# Patient Record
Sex: Female | Born: 1990 | Race: Black or African American | Hispanic: No | Marital: Single | Smoking: Never smoker
Health system: Southern US, Community
[De-identification: ages and names within clinical notes are randomized; demographics above are authoritative.]

## PROBLEM LIST (undated history)

## (undated) DIAGNOSIS — F419 Anxiety disorder, unspecified: Secondary | ICD-10-CM

## (undated) DIAGNOSIS — O039 Complete or unspecified spontaneous abortion without complication: Secondary | ICD-10-CM

## (undated) DIAGNOSIS — F32A Depression, unspecified: Secondary | ICD-10-CM

## (undated) DIAGNOSIS — F329 Major depressive disorder, single episode, unspecified: Secondary | ICD-10-CM

## (undated) HISTORY — DX: Anxiety disorder, unspecified: F41.9

## (undated) HISTORY — DX: Complete or unspecified spontaneous abortion without complication: O03.9

## (undated) HISTORY — PX: WISDOM TOOTH EXTRACTION: SHX21

## (undated) HISTORY — DX: Depression, unspecified: F32.A

---
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
## 2016-02-01 ENCOUNTER — Other Ambulatory Visit: Payer: Self-pay | Admitting: Chiropractic Medicine

## 2016-02-01 ENCOUNTER — Ambulatory Visit
Admission: RE | Admit: 2016-02-01 | Discharge: 2016-02-01 | Disposition: A | Discharge status: Home / Self Care | Payer: Self-pay | Source: Ambulatory Visit | Attending: Chiropractic Medicine | Admitting: Chiropractic Medicine

## 2016-02-01 DIAGNOSIS — M545 Low back pain: Secondary | ICD-10-CM

## 2018-03-03 ENCOUNTER — Ambulatory Visit: Payer: 59 | Admitting: Physician Assistant

## 2018-04-02 ENCOUNTER — Ambulatory Visit (INDEPENDENT_AMBULATORY_CARE_PROVIDER_SITE_OTHER): Payer: 59 | Admitting: Physician Assistant

## 2018-04-02 ENCOUNTER — Encounter: Payer: Self-pay | Admitting: Physician Assistant

## 2018-04-02 ENCOUNTER — Encounter

## 2018-04-02 VITALS — BP 106/68 | HR 73 | Ht 63.0 in | Wt 166.0 lb

## 2018-04-02 DIAGNOSIS — G4700 Insomnia, unspecified: Secondary | ICD-10-CM

## 2018-04-02 DIAGNOSIS — F4321 Adjustment disorder with depressed mood: Secondary | ICD-10-CM
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
## 2018-04-02 NOTE — Patient Instructions (Signed)
Recommend Multi-vitamin daily, B Complex, and Omega 3 (Fish Oil)

## 2018-04-03 ENCOUNTER — Encounter: Payer: Self-pay | Admitting: Physician Assistant

## 2018-04-03 MED ORDER — TRAZODONE HCL 50 MG PO TABS: 25.0000 mg | ORAL_TABLET | Freq: Every evening | ORAL | 0 refills | Status: DC | PRN

## 2018-05-11 ENCOUNTER — Ambulatory Visit: Payer: 59 | Admitting: Mental Health

## 2018-05-14 ENCOUNTER — Ambulatory Visit: Payer: 59 | Admitting: Physician Assistant

## 2019-01-31 ENCOUNTER — Ambulatory Visit
Admission: EM | Admit: 2019-01-31 | Discharge: 2019-01-31 | Disposition: A | Discharge status: Home / Self Care | Payer: No Typology Code available for payment source | Attending: Emergency Medicine | Admitting: Emergency Medicine

## 2019-01-31 ENCOUNTER — Other Ambulatory Visit: Payer: Self-pay

## 2019-01-31 DIAGNOSIS — Z20828 Contact with and (suspected) exposure to other viral communicable diseases: Secondary | ICD-10-CM | POA: Diagnosis not present

## 2019-01-31 DIAGNOSIS — Z20822 Contact with and (suspected) exposure to covid-19: Secondary | ICD-10-CM

## 2019-01-31 NOTE — Discharge Instructions (Addendum)
Your COVID test is pending - it is important to quarantine / isolate at home until your results are back. °If you test positive and would like further evaluation for persistent or worsening symptoms, you may schedule an E-visit or virtual (video) visit throughout the [facility] MyChart app or website. ° °PLEASE NOTE: If you develop severe chest pain or shortness of breath please go to the ER or call 9-1-1 for further evaluation --> DO NOT schedule electronic or virtual visits for this. °Please call our office for further guidance / recommendations as needed. °

## 2019-01-31 NOTE — ED Provider Notes (Signed)
[name]    CSN: [id number] Arrival date & time: 01/31/19  1518      History   Chief Complaint Chief Complaint  Patient presents with  . covid test/ diarrhea    HPI Stacy Collier is a 28 y.o. female   Presenting for Covid testing: Exposure: sister Date of exposure: 3 days ago Any fever, symptoms since exposure: yes - diarrhea this morning w/o blood, pain, melena.  Admits to eating dairy last night, to which she usually has loose stools thereafter.   History reviewed. No pertinent past medical history.  There are no problems to display for this patient.   Past Surgical History:  Procedure Laterality Date  . WISDOM TOOTH EXTRACTION      OB History   No obstetric history on file.      Home Medications    Prior to Admission medications   Medication Sig Start Date End Date Taking? Authorizing Provider  traZODone (DESYREL) 50 MG tablet Take 0.5-2 tablets (25-100 mg total) by mouth at bedtime as needed for sleep. 04/03/18   Cherie Ouch, PA-C    Family History Family History  Problem Relation Age of Onset  . Anxiety disorder Mother   . Schizophrenia Maternal Aunt   . Cancer Paternal Grandmother     Social History Social History   Tobacco Use  . Smoking status: Never Smoker  . Smokeless tobacco: Never Used  Substance Use Topics  . Alcohol use: Yes    Alcohol/week: 1.0 standard drinks    Types: 1 Glasses of wine per week    Comment: per week  . Drug use: Never     Allergies   Patient has no known allergies.   Review of Systems Review of Systems  Constitutional: Negative for activity change, appetite change, fatigue and fever.  HENT: Negative for ear pain, sinus pain, sore throat and voice change.   Eyes: Negative for pain, redness and visual disturbance.  Respiratory: Negative for cough and shortness of breath.   Cardiovascular: Negative for chest pain and palpitations.  Gastrointestinal: Positive for diarrhea. Negative  for abdominal distention, abdominal pain, blood in stool, nausea, rectal pain and vomiting.  Musculoskeletal: Negative for arthralgias and myalgias.  Skin: Negative for rash and wound.  Neurological: Negative for syncope and headaches.     Physical Exam Triage Vital Signs ED Triage Vitals  Enc Vitals Group     BP 01/31/19 1554 125/80     Pulse Rate 01/31/19 1554 76     Resp 01/31/19 1554 16     Temp 01/31/19 1554 99.1 F (37.3 C)     Temp Source 01/31/19 1554 Oral     SpO2 01/31/19 1554 98 %     Weight --      Height --      Head Circumference --      Peak Flow --      Pain Score 01/31/19 1555 0     Pain Loc --      Pain Edu? --      Excl. in GC? --    No data found.  Updated Vital Signs BP 125/80 (BP Location: Left Arm)   Pulse 76   Temp 99.1 F (37.3 C) (Oral)   Resp 16   SpO2 98%   Visual Acuity Right Eye Distance:   Left Eye Distance:   Bilateral Distance:    Right Eye Near:   Left Eye Near:    Bilateral Near:     Physical  Exam Constitutional:      General: She is not in acute distress.    Appearance: She is not ill-appearing.  HENT:     Head: Normocephalic and atraumatic.  Eyes:     General: No scleral icterus.    Pupils: Pupils are equal, round, and reactive to light.  Cardiovascular:     Rate and Rhythm: Normal rate.  Pulmonary:     Effort: Pulmonary effort is normal. No respiratory distress.     Breath sounds: No wheezing.  Abdominal:     General: Bowel sounds are normal.     Tenderness: There is no abdominal tenderness.  Skin:    Coloration: Skin is not jaundiced or pale.  Neurological:     Mental Status: She is alert and oriented to person, place, and time.      UC Treatments / Results  Labs (all labs ordered are listed, but only abnormal results are displayed) Labs Reviewed  NOVEL CORONAVIRUS, NAA   Narrative:    Performed at:  [address] [place], [place]  [id number] Lab Director: Rush Farmer MD,  Phone:  [phone]    EKG   Radiology No results found.  Procedures Procedures (including critical care time)  Medications Ordered in UC Medications - No data to display  Initial Impression / Assessment and Plan / UC Course  I have reviewed the triage vital signs and the nursing notes.  Pertinent labs & imaging results that were available during my care of the patient were reviewed by me and considered in my medical decision making (see chart for details).     Patient afebrile, nontoxic, with SpO2 98%.  Covid PCR pending.  Patient to quarantine until results are back.  We will continue supportive management. Patient to avoid dairy.  Return precautions discussed, patient verbalized understanding and is agreeable to plan. Final Clinical Impressions(s) / UC Diagnoses   Final diagnoses:  Exposure to COVID-19 virus     Discharge Instructions     Your COVID test is pending - it is important to quarantine / isolate at home until your results are back. If you test positive and would like further evaluation for persistent or worsening symptoms, you may schedule an E-visit or virtual (video) visit throughout the [facility] app or website.  PLEASE NOTE: If you develop severe chest pain or shortness of breath please go to the ER or call 9-1-1 for further evaluation --> DO NOT schedule electronic or virtual visits for this. Please call our office for further guidance / recommendations as needed.    ED Prescriptions    None     PDMP not reviewed this encounter.   Hall[place], [place], [place] 02/03/19 1239

## 2019-01-31 NOTE — ED Triage Notes (Signed)
Pt presents to UC stating she had a positive covid exposure 3 days ago. Pt states she had diarrhea this morning, but it could have been from eating a lot of dairy.

## 2019-02-01 ENCOUNTER — Telehealth: Payer: Self-pay | Admitting: Emergency Medicine

## 2019-02-01 LAB — NOVEL CORONAVIRUS, NAA: SARS-CoV-2, NAA: NOT DETECTED

## 2019-02-01 NOTE — Telephone Encounter (Signed)
Returned patient's call requesting a work note to be out of work until next Tuesday.  This RN explained that patient tested Negative and was able to return to work if needed.  Patient states her work told her she had to be out.  This RN explained there was no medical reason for her to be out, and we could not write a note stating so.  Patient verbalized understanding and will talk to her job about what follow-up she needs.

## 2019-02-02 ENCOUNTER — Telehealth: Payer: Self-pay | Admitting: Emergency Medicine

## 2019-02-02 NOTE — Telephone Encounter (Signed)
Pt called back today stating her HR department is requesting specific wording in the note we provided her for being out.  This RN explained that we are not able to accommodate their requests, as our providers have written on the note what they felt was appropriate.  This RN stated she could provide the employer/HR department with our phone number and they could call for further explanation if needed, without releasing any of patient's medical information.  Patient verbalized understanding.

## 2019-05-20 ENCOUNTER — Encounter: Payer: Self-pay | Admitting: Physician Assistant

## 2019-05-20 ENCOUNTER — Ambulatory Visit (INDEPENDENT_AMBULATORY_CARE_PROVIDER_SITE_OTHER): Payer: No Typology Code available for payment source | Admitting: Physician Assistant

## 2019-05-20 ENCOUNTER — Other Ambulatory Visit: Payer: Self-pay

## 2019-05-20 DIAGNOSIS — F4321 Adjustment disorder with depressed mood: Secondary | ICD-10-CM

## 2019-05-20 DIAGNOSIS — G4700 Insomnia, unspecified: Secondary | ICD-10-CM | POA: Diagnosis not present

## 2019-05-20 MED ORDER — TRAZODONE HCL 50 MG PO TABS: 25.0000 mg | ORAL_TABLET | Freq: Every evening | ORAL | 0 refills | Status: DC | PRN

## 2019-05-20 NOTE — Progress Notes (Signed)
[facility] Med Check  Patient ID: Stacy Collier,  MRN: [id number]  PCP: Patient, No Pcp Per  Date of Evaluation: 05/20/2019 Time spent:20 minutes  Chief Complaint:  Chief Complaint    Stress      HISTORY/CURRENT STATUS: HPI Not doing well.  This past year has been very stressful.  When she first came Feb 2020, she had been through a bad break up, but they got back together and got engaged in October. She's been planning her wedding and then a few weeks or so ago, he called off the wedding. Last week she was really distraught.  Was not sleeping well and was more tearful.  She feels a lot better this week.  She is in counseling with her pastor.  Stacy Collier and her fianc are still in contact but she is having tough time knowing what to do from here.  She understandably has been very sad but not in a clinical depression.  She is able to go to work.  Energy and motivation are good.  Appetite has not changed.  She is not isolating.  She is not crying easily.  No suicidal or homicidal thoughts.  No extreme anxiety.  She does not sleep as well as she would like but it is better than last week.  Individual Medical History/ Review of Systems: Changes? :No    Past medications for mental health diagnoses include: None  Allergies: Patient has no known allergies.  Current Medications:  Current Outpatient Medications:  .  traZODone (DESYREL) 50 MG tablet, Take 0.5-2 tablets (25-100 mg total) by mouth at bedtime as needed for sleep., Disp: 30 tablet, Rfl: 0 Medication Side Effects: none  Family Medical/ Social History: Changes? No  MENTAL HEALTH EXAM:  There were no vitals taken for this visit.There is no height or weight on file to calculate BMI.  General Appearance: Casual, Neat and Well Groomed  Eye Contact:  Good  Speech:  Clear and Coherent and Normal Rate  Volume:  Normal  Mood:  Euthymic  Affect:  Appropriate  Thought Process:  Goal Directed and Descriptions of  Associations: Intact  Orientation:  Full (Time, Place, and Person)  Thought Content: Logical   Suicidal Thoughts:  No  Homicidal Thoughts:  No  Memory:  WNL  Judgement:  Good  Insight:  Good  Psychomotor Activity:  Normal  Concentration:  Concentration: Good  Recall:  Good  Fund of Knowledge: Good  Language: Good  Assets:  Desire for Improvement  ADL's:  Intact  Cognition: WNL  Prognosis:  Good    DIAGNOSES:    ICD-10-CM   1. Insomnia, unspecified type  G47.00   2. Adjustment disorder with depressed mood  F43.21     Receiving Psychotherapy: Yes    RECOMMENDATIONS:  PDMP was reviewed. I spent 20 minutes with her. We discussed sleep hygiene and stress management. Restart trazodone 50 mg, 1/2-2 p.o. nightly as needed. Continue counseling. Return as needed.  Melony Overly, PA-C

## 2019-07-07 ENCOUNTER — Ambulatory Visit: Payer: No Typology Code available for payment source | Admitting: Family Medicine

## 2019-08-06 ENCOUNTER — Ambulatory Visit: Payer: No Typology Code available for payment source | Attending: Nurse Practitioner | Admitting: Nurse Practitioner

## 2019-08-06 ENCOUNTER — Other Ambulatory Visit: Payer: Self-pay

## 2019-08-06 ENCOUNTER — Encounter: Payer: Self-pay | Admitting: Nurse Practitioner

## 2019-08-06 VITALS — Ht 63.0 in | Wt 160.0 lb

## 2019-08-06 DIAGNOSIS — Z7689 Persons encountering health services in other specified circumstances: Secondary | ICD-10-CM
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
## 2019-08-11 ENCOUNTER — Other Ambulatory Visit: Payer: Self-pay

## 2019-08-11 ENCOUNTER — Ambulatory Visit: Payer: No Typology Code available for payment source | Attending: Nurse Practitioner

## 2019-08-11 DIAGNOSIS — Z7689 Persons encountering health services in other specified circumstances: Secondary | ICD-10-CM

## 2019-08-12 ENCOUNTER — Other Ambulatory Visit: Payer: Self-pay | Admitting: Nurse Practitioner

## 2019-08-12 LAB — CBC WITH DIFFERENTIAL/PLATELET
Basophils Absolute: 0.1 10*3/uL (ref 0.0–0.2)
Basos: 1 %
EOS (ABSOLUTE): 0.1 10*3/uL (ref 0.0–0.4)
Eos: 1 %
Hematocrit: 37.7 % (ref 34.0–46.6)
Hemoglobin: 11 g/dL — ABNORMAL LOW (ref 11.1–15.9)
Immature Grans (Abs): 0 10*3/uL (ref 0.0–0.1)
Immature Granulocytes: 0 %
Lymphocytes Absolute: 1.6 10*3/uL (ref 0.7–3.1)
Lymphs: 29 %
MCH: 21.5 pg — ABNORMAL LOW (ref 26.6–33.0)
MCHC: 29.2 g/dL — ABNORMAL LOW (ref 31.5–35.7)
MCV: 74 fL — ABNORMAL LOW (ref 79–97)
Monocytes Absolute: 0.4 10*3/uL (ref 0.1–0.9)
Monocytes: 6 %
Neutrophils Absolute: 3.4 10*3/uL (ref 1.4–7.0)
Neutrophils: 63 %
Platelets: 356 10*3/uL (ref 150–450)
RBC: 5.11 x10E6/uL (ref 3.77–5.28)
RDW: 15.7 % — ABNORMAL HIGH (ref 11.7–15.4)
WBC: 5.5 10*3/uL (ref 3.4–10.8)

## 2019-08-12 LAB — HEMOGLOBIN A1C
Est. average glucose Bld gHb Est-mCnc: 108 mg/dL
Hgb A1c MFr Bld: 5.4 % (ref 4.8–5.6)

## 2019-08-12 LAB — CMP14+EGFR
ALT: 8 IU/L (ref 0–32)
AST: 16 IU/L (ref 0–40)
Albumin/Globulin Ratio: 1.8 (ref 1.2–2.2)
Albumin: 4.8 g/dL (ref 3.9–5.0)
Alkaline Phosphatase: 96 IU/L (ref 48–121)
BUN/Creatinine Ratio: 20 (ref 9–23)
BUN: 18 mg/dL (ref 6–20)
Bilirubin Total: 0.4 mg/dL (ref 0.0–1.2)
CO2: 24 mmol/L (ref 20–29)
Calcium: 9.5 mg/dL (ref 8.7–10.2)
Chloride: 101 mmol/L (ref 96–106)
Creatinine, Ser: 0.92 mg/dL (ref 0.57–1.00)
GFR calc Af Amer: 99 mL/min/{1.73_m2} (ref >59)
GFR calc non Af Amer: 86 mL/min/{1.73_m2} (ref >59)
Globulin, Total: 2.7 g/dL (ref 1.5–4.5)
Glucose: 93 mg/dL (ref 65–99)
Potassium: 4.1 mmol/L (ref 3.5–5.2)
Sodium: 138 mmol/L (ref 134–144)
Total Protein: 7.5 g/dL (ref 6.0–8.5)

## 2019-08-12 LAB — LIPID PANEL
Chol/HDL Ratio: 2.2 ratio (ref 0.0–4.4)
Cholesterol, Total: 179 mg/dL (ref 100–199)
HDL: 80 mg/dL (ref >39)
LDL Chol Calc (NIH): 86 mg/dL (ref 0–99)
Triglycerides: 67 mg/dL (ref 0–149)
VLDL Cholesterol Cal: 13 mg/dL (ref 5–40)

## 2019-08-12 LAB — VITAMIN D 25 HYDROXY (VIT D DEFICIENCY, FRACTURES): Vit D, 25-Hydroxy: 15.7 ng/mL — ABNORMAL LOW (ref 30.0–100.0)

## 2019-08-12 LAB — HIV ANTIBODY (ROUTINE TESTING W REFLEX): HIV Screen 4th Generation wRfx: NONREACTIVE

## 2019-08-12 LAB — HEPATITIS C ANTIBODY: Hep C Virus Ab: <0.1 s/co ratio (ref 0.0–0.9)

## 2019-08-12 MED ORDER — VITAMIN D (ERGOCALCIFEROL) 1.25 MG (50000 UNIT) PO CAPS: 50000.0000 [IU] | ORAL_CAPSULE | ORAL | 0 refills | Status: DC

## 2019-09-17 ENCOUNTER — Emergency Department (HOSPITAL_COMMUNITY)
Admission: EM | Admit: 2019-09-17 | Discharge: 2019-09-17 | Disposition: A | Discharge status: Home / Self Care | Payer: No Typology Code available for payment source | Attending: Emergency Medicine | Admitting: Emergency Medicine

## 2019-09-17 ENCOUNTER — Emergency Department (HOSPITAL_COMMUNITY): Payer: No Typology Code available for payment source

## 2019-09-17 ENCOUNTER — Encounter (HOSPITAL_COMMUNITY): Payer: Self-pay

## 2019-09-17 DIAGNOSIS — R509 Fever, unspecified: Secondary | ICD-10-CM | POA: Diagnosis present

## 2019-09-17 DIAGNOSIS — U071 COVID-19: Secondary | ICD-10-CM | POA: Diagnosis not present

## 2019-09-17 MED ORDER — ACETAMINOPHEN 325 MG PO TABS: 650.0000 mg | ORAL_TABLET | Freq: Once | ORAL | Status: DC

## 2019-09-17 MED ORDER — ALBUTEROL SULFATE HFA 108 (90 BASE) MCG/ACT IN AERS
2.0000 | INHALATION_SPRAY | Freq: Once | RESPIRATORY_TRACT | Status: AC
  Administered 2019-09-17: 2 via RESPIRATORY_TRACT
  Filled 2019-09-17: qty 6.7

## 2019-09-17 NOTE — ED Triage Notes (Signed)
Pt arrives POV for eval of covid sx. Pt reports dx'd last night. Reports fatigue, muscle aches, weakness, SOB, vomiting.

## 2019-09-17 NOTE — Discharge Instructions (Signed)
You are having symptoms consistent with your Covid diagnosis.  You need to quarantine for 10 days at home.  Expect myalgias and fever.  Take Tylenol or Motrin for fever.  Use albuterol as needed for cough or shortness of breath.  Please pick up a pulse ox machine and return to the ER if your oxygen level is consistently less than 90%.  See your doctor for follow-up.  Return to ER if you have worse shortness of breath, trouble breathing, dehydration.     Person Under Monitoring Name: Stacy Collier  Location: [address] [place] [address]   Infection Prevention Recommendations for Individuals Confirmed to have, or Being Evaluated for, 2019 Novel Coronavirus (COVID-19) Infection Who Receive Care at Home  Individuals who are confirmed to have, or are being evaluated for, COVID-19 should follow the prevention steps below until a healthcare provider or local or state health department says they can return to normal activities.  Stay home except to get medical care You should restrict activities outside your home, except for getting medical care. Do not go to work, school, or public areas, and do not use public transportation or taxis.  Call ahead before visiting your doctor Before your medical appointment, call the healthcare provider and tell them that you have, or are being evaluated for, COVID-19 infection. This will help the healthcare provider's office take steps to keep other people from getting infected. Ask your healthcare provider to call the local or state health department.  Monitor your symptoms Seek prompt medical attention if your illness is worsening (e.g., difficulty breathing). Before going to your medical appointment, call the healthcare provider and tell them that you have, or are being evaluated for, COVID-19 infection. Ask your healthcare provider to call the local or state health department.  Wear a facemask You should wear a facemask that  covers your nose and mouth when you are in the same room with other people and when you visit a healthcare provider. People who live with or visit you should also wear a facemask while they are in the same room with you.  Separate yourself from other people in your home As much as possible, you should stay in a different room from other people in your home. Also, you should use a separate bathroom, if available.  Avoid sharing household items You should not share dishes, drinking glasses, cups, eating utensils, towels, bedding, or other items with other people in your home. After using these items, you should wash them thoroughly with soap and water.  Cover your coughs and sneezes Cover your mouth and nose with a tissue when you cough or sneeze, or you can cough or sneeze into your sleeve. Throw used tissues in a lined trash can, and immediately wash your hands with soap and water for at least 20 seconds or use an alcohol-based hand rub.  Wash your [employer] your hands often and thoroughly with soap and water for at least 20 seconds. You can use an alcohol-based hand sanitizer if soap and water are not available and if your hands are not visibly dirty. Avoid touching your eyes, nose, and mouth with unwashed hands.   Prevention Steps for Caregivers and Household Members of Individuals Confirmed to have, or Being Evaluated for, COVID-19 Infection Being Cared for in the Home  If you live with, or provide care at home for, a person confirmed to have, or being evaluated for, COVID-19 infection please follow these guidelines to prevent infection:  Follow healthcare  provider's instructions Make sure that you understand and can help the patient follow any healthcare provider instructions for all care.  Provide for the patient's basic needs You should help the patient with basic needs in the home and provide support for getting groceries, prescriptions, and other personal needs.  Monitor  the patient's symptoms If they are getting sicker, call his or her medical provider and tell them that the patient has, or is being evaluated for, COVID-19 infection. This will help the healthcare provider's office take steps to keep other people from getting infected. Ask the healthcare provider to call the local or state health department.  Limit the number of people who have contact with the patient If possible, have only one caregiver for the patient. Other household members should stay in another home or place of residence. If this is not possible, they should stay in another room, or be separated from the patient as much as possible. Use a separate bathroom, if available. Restrict visitors who do not have an essential need to be in the home.  Keep older adults, very young children, and other sick people away from the patient Keep older adults, very young children, and those who have compromised immune systems or chronic health conditions away from the patient. This includes people with chronic heart, lung, or kidney conditions, diabetes, and cancer.  Ensure good ventilation Make sure that shared spaces in the home have good air flow, such as from an air conditioner or an opened window, weather permitting.  Wash your hands often Wash your hands often and thoroughly with soap and water for at least 20 seconds. You can use an alcohol based hand sanitizer if soap and water are not available and if your hands are not visibly dirty. Avoid touching your eyes, nose, and mouth with unwashed hands. Use disposable paper towels to dry your hands. If not available, use dedicated cloth towels and replace them when they become wet.  Wear a facemask and gloves Wear a disposable facemask at all times in the room and gloves when you touch or have contact with the patient's blood, body fluids, and/or secretions or excretions, such as sweat, saliva, sputum, nasal mucus, vomit, urine, or feces.  Ensure the  mask fits over your nose and mouth tightly, and do not touch it during use. Throw out disposable facemasks and gloves after using them. Do not reuse. Wash your hands immediately after removing your facemask and gloves. If your personal clothing becomes contaminated, carefully remove clothing and launder. Wash your hands after handling contaminated clothing. Place all used disposable facemasks, gloves, and other waste in a lined container before disposing them with other household waste. Remove gloves and wash your hands immediately after handling these items.  Do not share dishes, glasses, or other household items with the patient Avoid sharing household items. You should not share dishes, drinking glasses, cups, eating utensils, towels, bedding, or other items with a patient who is confirmed to have, or being evaluated for, COVID-19 infection. After the person uses these items, you should wash them thoroughly with soap and water.  Wash laundry thoroughly Immediately remove and wash clothes or bedding that have blood, body fluids, and/or secretions or excretions, such as sweat, saliva, sputum, nasal mucus, vomit, urine, or feces, on them. Wear gloves when handling laundry from the patient. Read and follow directions on labels of laundry or clothing items and detergent. In general, wash and dry with the warmest temperatures recommended on the label.  Clean all  areas the individual has used often Clean all touchable surfaces, such as counters, tabletops, doorknobs, bathroom fixtures, toilets, phones, keyboards, tablets, and bedside tables, every day. Also, clean any surfaces that may have blood, body fluids, and/or secretions or excretions on them. Wear gloves when cleaning surfaces the patient has come in contact with. Use a diluted bleach solution (e.g., dilute bleach with 1 part bleach and 10 parts water) or a household disinfectant with a label that says EPA-registered for coronaviruses. To make  a bleach solution at home, add 1 tablespoon of bleach to 1 quart (4 cups) of water. For a larger supply, add  cup of bleach to 1 gallon (16 cups) of water. Read labels of cleaning products and follow recommendations provided on product labels. Labels contain instructions for safe and effective use of the cleaning product including precautions you should take when applying the product, such as wearing gloves or eye protection and making sure you have good ventilation during use of the product. Remove gloves and wash hands immediately after cleaning.  Monitor yourself for signs and symptoms of illness Caregivers and household members are considered close contacts, should monitor their health, and will be asked to limit movement outside of the home to the extent possible. Follow the monitoring steps for close contacts listed on the symptom monitoring form.   ? If you have additional questions, contact your local health department or call the epidemiologist on call at [phone] (available 24/7). ? This guidance is subject to change. For the most up-to-date guidance from [facility], please refer to their website: [email or web address]

## 2019-09-17 NOTE — ED Provider Notes (Signed)
[facility] EMERGENCY DEPARTMENT Provider Note   CSN: [id number] Arrival date & time: 09/17/19  1355     History Chief Complaint  Patient presents with  . Covid Positive   . Shortness of Breath    Stacy Collier is a 29 y.o. female history of depression anxiety here presenting with fever myalgias and positive Covid. Patient states that for the last 2 to 3 days, she has been having myalgias and sore throat.  Patient went to pharmacy and was tested for Covid and turned out positive.  She has some subjective shortness of breath so came here for evaluation.  Patient states that she lives at home by herself and does not know how she got Covid.  She states that she is otherwise healthy. She didn't get COVID vaccine   The history is provided by the patient.       Past Medical History:  Diagnosis Date  . Anxiety   . Depression     There are no problems to display for this patient.   Past Surgical History:  Procedure Laterality Date  . WISDOM TOOTH EXTRACTION       OB History   No obstetric history on file.     Family History  Problem Relation Age of Onset  . Anxiety disorder Mother   . Schizophrenia Maternal Aunt   . Cancer Paternal Grandmother     Social History   Tobacco Use  . Smoking status: Never Smoker  . Smokeless tobacco: Never Used  Substance Use Topics  . Alcohol use: Yes    Alcohol/week: 1.0 standard drink    Types: 1 Glasses of wine per week    Comment: socially  . Drug use: Never    Home Medications Prior to Admission medications   Medication Sig Start Date End Date Taking? Authorizing Provider  traZODone (DESYREL) 50 MG tablet Take 0.5-2 tablets (25-100 mg total) by mouth at bedtime as needed for sleep. 05/20/19   Melony Overly T, PA-C  Vitamin D, Ergocalciferol, (DRISDOL) 1.25 MG (50000 UNIT) CAPS capsule Take 1 capsule (50,000 Units total) by mouth every 7 (seven) days. 08/12/19   Claiborne Rigg, NP    Allergies      Patient has no known allergies.  Review of Systems   Review of Systems  Constitutional: Positive for chills.  HENT: Positive for sore throat.   Respiratory: Positive for cough.   All other systems reviewed and are negative.   Physical Exam Updated Vital Signs BP 109/74   Pulse 93   Temp (!) 100.8 F (38.2 C) (Oral)   Resp 20   Ht 5\' 3"  (1.6 m)   Wt 73 kg   SpO2 100%   BMI 28.51 kg/m   Physical Exam Vitals and nursing note reviewed.  HENT:     Head: Normocephalic.  Eyes:     Pupils: Pupils are equal, round, and reactive to light.  Cardiovascular:     Rate and Rhythm: Normal rate and regular rhythm.  Pulmonary:     Comments: Tachypneic, no wheezing, diminished bilaterally  Abdominal:     Palpations: Abdomen is soft.  Musculoskeletal:        General: Normal range of motion.     Cervical back: Normal range of motion.  Skin:    General: Skin is warm.     Capillary Refill: Capillary refill takes less than 2 seconds.  Neurological:     General: No focal deficit present.     Mental Status:  She is alert and oriented to person, place, and time.  Psychiatric:        Mood and Affect: Mood normal.        Behavior: Behavior normal.     ED Results / Procedures / Treatments   Labs (all labs ordered are listed, but only abnormal results are displayed) Labs Reviewed - No data to display  EKG EKG Interpretation  Date/Time:  Friday September 17 2019 14:26:02 EDT Ventricular Rate:  94 PR Interval:  142 QRS Duration: 70 QT Interval:  328 QTC Calculation: 410 R Axis:   28 Text Interpretation: Normal sinus rhythm Nonspecific T wave abnormality Abnormal ECG No previous ECGs available Confirmed by Richardean Canal [phone]) on 09/17/2019 3:37:01 PM   Radiology DG Chest Portable 1 View  Result Date: 09/17/2019 CLINICAL DATA:  Provided history: COVID, chest pain. Additional history provided: Patient diagnosed with COVID last night, reports fatigue, muscle aches, weakness, shortness  of breath, vomiting. EXAM: PORTABLE CHEST 1 VIEW COMPARISON:  Report from chest radiographs 05/02/1999 (images unavailable). FINDINGS: Heart size within normal limits. No appreciable airspace consolidation. No evidence of pleural effusion or pneumothorax. No acute bony abnormality identified. IMPRESSION: No evidence of active cardiopulmonary disease. Electronically Signed   By: Jackey Loge DO   On: 09/17/2019 16:17    Procedures Procedures (including critical care time)  Medications Ordered in ED Medications  acetaminophen (TYLENOL) tablet 650 mg (has no administration in time range)    ED Course  I have reviewed the triage vital signs and the nursing notes.  Pertinent labs & imaging results that were available during my care of the patient were reviewed by me and considered in my medical decision making (see chart for details).    MDM Rules/Calculators/A&P                         Stacy Collier is a 29 y.o. female who just tested because of a Covid who presented with some shortness of breath.  Patient has low-grade temp in the ED but her oxygen level is normal.  Her chest x-ray is clear.  I think her symptoms are seen consistent with Covid.  She does not meet criteria for admission right now.  I told her to stay home and quarantine for 10 days.  She can use albuterol as needed and Tylenol and Motrin for fevers.  Stacy Collier was evaluated in Emergency Department on 09/17/2019 for the symptoms described in the history of present illness. She was evaluated in the context of the global COVID-19 pandemic, which necessitated consideration that the patient might be at risk for infection with the SARS-CoV-2 virus that causes COVID-19. Institutional protocols and algorithms that pertain to the evaluation of patients at risk for COVID-19 are in a state of rapid change based on information released by regulatory bodies including the CDC and federal and state organizations. These policies and  algorithms were followed during the patient's care in the ED.    Final Clinical Impression(s) / ED Diagnoses Final diagnoses:  None    Rx / DC Orders ED Discharge Orders    None       Charlynne Pander, MD 09/17/19 [phone]

## 2019-09-23 ENCOUNTER — Telehealth (INDEPENDENT_AMBULATORY_CARE_PROVIDER_SITE_OTHER): Payer: No Typology Code available for payment source | Admitting: Physician Assistant

## 2019-09-23 ENCOUNTER — Encounter: Payer: Self-pay | Admitting: Physician Assistant

## 2019-09-23 ENCOUNTER — Telehealth: Payer: Self-pay | Admitting: Physician Assistant

## 2019-09-23 DIAGNOSIS — U071 COVID-19: Secondary | ICD-10-CM | POA: Diagnosis not present

## 2019-09-23 DIAGNOSIS — F4321 Adjustment disorder with depressed mood: Secondary | ICD-10-CM | POA: Diagnosis not present

## 2019-09-23 MED ORDER — FLUVOXAMINE MALEATE 100 MG PO TABS
ORAL_TABLET | ORAL | 0 refills | Status: DC
Start: 2019-09-23 — End: 2020-01-18

## 2019-09-23 NOTE — Progress Notes (Signed)
[facility]  Patient ID: Stacy Collier,  MRN: [id number]  PCP: Claiborne Rigg, NP  Date of Evaluation: 09/23/2019 Time spent:20 minutes  Chief Complaint:  Chief Complaint    Depression      HISTORY/CURRENT STATUS: HPI not doing well.  She has had relationship problems off and on for several years.  She and her boyfriend had broken up several times, got back together, and then were planning to get married in May 2020.  But then he broke it off.  They got back together again and were planning to move in together soon, and plan to marry next August.  They had another conversation a few weeks ago about where their relationship was going, and she has felt like she is the only one trying.  So she broke it off.  They have been seeing their pastor for premarital counseling but no other counselors.  She feels even worse about the situation because she now has COVID.  She has been pretty sick for a week.  She is still in quarantine and being alone makes it a lot worse.  He has low energy and motivation but that could just be from the physical symptoms of COVID.  States her heart is broken.  Not having panic attacks or generalized anxiety.  She does get triggered if she sees something that he gave her or a car that looks like his, but when that happens it causes more sadness and crying, not anxiety.  Denies suicidal or homicidal thoughts.  Denies dizziness, syncope, seizures, numbness, tingling, tremor, tics, unsteady gait, slurred speech, confusion. Denies muscle or joint pain, stiffness, or dystonia.  Individual Medical History/ Review of Systems: Changes? :No    Past medications for mental health diagnoses include: Trazodone  Allergies: Patient has no known allergies.  Current Medications:  Current Outpatient Medications:  .  ELDERBERRY PO, Take by mouth., Disp: , Rfl:  .  Vitamin D, Ergocalciferol, (DRISDOL) 1.25 MG (50000 UNIT) CAPS capsule, Take 1 capsule (50,000 Units  total) by mouth every 7 (seven) days., Disp: 12 capsule, Rfl: 0 .  zinc gluconate 50 MG tablet, Take 50 mg by mouth daily., Disp: , Rfl:  .  fluvoxaMINE (LUVOX) 100 MG tablet, 1 p.o. twice daily for 2 days, then 1 p.o. 3 times daily, Disp: 40 tablet, Rfl: 0 .  traZODone (DESYREL) 50 MG tablet, Take 0.5-2 tablets (25-100 mg total) by mouth at bedtime as needed for sleep., Disp: 30 tablet, Rfl: 0 Medication Side Effects: none  Family Medical/ Social History: Changes? Yes see HPI  MENTAL HEALTH EXAM:  There were no vitals taken for this visit.There is no height or weight on file to calculate BMI.  General Appearance: Casual, Neat and Well Groomed  Eye Contact:  Good  Speech:  Clear and Coherent and Normal Rate  Volume:  Normal  Mood:  Depressed  Affect:  Depressed and Tearful  Thought Process:  Goal Directed and Descriptions of Associations: Intact  Orientation:  Full (Time, Place, and Person)  Thought Content: Logical   Suicidal Thoughts:  No  Homicidal Thoughts:  No  Memory:  WNL  Judgement:  Good  Insight:  Good  Psychomotor Activity:  Normal  Concentration:  Concentration: Good  Recall:  Good  Fund of Knowledge: Good  Language: Good  Assets:  Desire for Improvement  ADL's:  Intact  Cognition: WNL  Prognosis:  Good    DIAGNOSES:    ICD-10-CM   1. Adjustment disorder with depressed mood  F43.21   2. COVID-19  U07.1     Receiving Psychotherapy: No    RECOMMENDATIONS:  PDMP was reviewed. I provided 20 minutes of nonface-to-face time during this encounter. I discussed the information on Luvox and how a very small study showed that it can treat/prevent long-haul symptoms of COVID.  But more importantly is that it is an antidepressant and I believe it could help with both COVID and the depression.  We discussed the benefits, risks, side effects and she accepts. Start Luvox 100 mg, 1 p.o. twice daily for 2 days, then 1 p.o. 3 times daily until gone. Strongly recommend she  see a Veterinary surgeon. Return in 3 weeks.   Melony Overly, PA-C

## 2019-09-23 NOTE — Telephone Encounter (Signed)
Ms. nasira, janusz are scheduled for a virtual visit with your provider today.    Just as we do with appointments in the office, we must obtain your consent to participate.  Your consent will be active for this visit and any virtual visit you may have with one of our providers in the next 365 days.    If you have a MyChart account, I can also send a copy of this consent to you electronically.  All virtual visits are billed to your insurance company just like a traditional visit in the office.  As this is a virtual visit, video technology does not allow for your provider to perform a traditional examination.  This may limit your provider's ability to fully assess your condition.  If your provider identifies any concerns that need to be evaluated in person or the need to arrange testing such as labs, EKG, etc, we will make arrangements to do so.    Although advances in technology are sophisticated, we cannot ensure that it will always work on either your end or our end.  If the connection with a video visit is poor, we may have to switch to a telephone visit.  With either a video or telephone visit, we are not always able to ensure that we have a secure connection.   I need to obtain your verbal consent now.   Are you willing to proceed with your visit today?   Jakeline S Yore has provided verbal consent on 09/23/2019 for a virtual visit (video or telephone).   Melony Overly, PA-C 09/23/2019  5:37 PM

## 2019-09-24 ENCOUNTER — Ambulatory Visit: Payer: No Typology Code available for payment source | Admitting: Nurse Practitioner

## 2019-12-08 ENCOUNTER — Ambulatory Visit: Payer: No Typology Code available for payment source | Admitting: Nurse Practitioner

## 2019-12-10 ENCOUNTER — Ambulatory Visit: Payer: No Typology Code available for payment source | Attending: Nurse Practitioner | Admitting: Nurse Practitioner

## 2019-12-10 ENCOUNTER — Other Ambulatory Visit: Payer: Self-pay

## 2020-01-18 ENCOUNTER — Ambulatory Visit: Payer: No Typology Code available for payment source | Attending: Nurse Practitioner | Admitting: Nurse Practitioner

## 2020-01-18 ENCOUNTER — Encounter: Payer: Self-pay | Admitting: Nurse Practitioner

## 2020-01-18 ENCOUNTER — Other Ambulatory Visit: Payer: Self-pay

## 2020-01-18 VITALS — BP 111/72 | HR 67 | Ht 63.0 in | Wt 156.0 lb

## 2020-01-18 DIAGNOSIS — Z124 Encounter for screening for malignant neoplasm of cervix: Secondary | ICD-10-CM | POA: Diagnosis not present

## 2020-01-18 NOTE — Progress Notes (Signed)
Assessment & Plan:  Stacy Collier was seen today for gynecologic exam.  Diagnoses and all orders for this visit:  Encounter for Papanicolaou smear for cervical cancer screening -     Cytology - PAP -     Cervicovaginal ancillary only    Patient has been counseled on age-appropriate routine health concerns for screening and prevention. These are reviewed and up-to-date. Referrals have been placed accordingly. Immunizations are up-to-date or declined.    Subjective:   Chief Complaint  Patient presents with   Gynecologic Exam    Pt. Is here for a pap smear.    HPI Stacy Collier 29 y.o. female presents to office today for PAP smear.     Review of Systems  Constitutional: Negative.  Negative for chills, fever, malaise/fatigue and weight loss.  Respiratory: Negative.  Negative for cough, shortness of breath and wheezing.   Cardiovascular: Negative.  Negative for chest pain, orthopnea and leg swelling.  Gastrointestinal: Negative for abdominal pain.  Genitourinary: Negative.  Negative for flank pain.  Skin: Negative.  Negative for rash.  Psychiatric/Behavioral: Negative for suicidal ideas.    Past Medical History:  Diagnosis Date   Anxiety    Depression     Past Surgical History:  Procedure Laterality Date   WISDOM TOOTH EXTRACTION      Family History  Problem Relation Age of Onset   Anxiety disorder Mother    Schizophrenia Maternal Aunt    Cancer Paternal Grandmother     Social History Reviewed with no changes to be made today.   Outpatient Medications Prior to Visit  Medication Sig Dispense Refill   ELDERBERRY PO Take by mouth.     zinc gluconate 50 MG tablet Take 50 mg by mouth daily.     fluvoxaMINE (LUVOX) 100 MG tablet 1 p.o. twice daily for 2 days, then 1 p.o. 3 times daily (Patient not taking: Reported on 01/18/2020) 40 tablet 0   traZODone (DESYREL) 50 MG tablet Take 0.5-2 tablets (25-100 mg total) by mouth at bedtime as needed for sleep. 30  tablet 0   Vitamin D, Ergocalciferol, (DRISDOL) 1.25 MG (50000 UNIT) CAPS capsule Take 1 capsule (50,000 Units total) by mouth every 7 (seven) days. 12 capsule 0   No facility-administered medications prior to visit.    No Known Allergies     Objective:    BP 111/72 (BP Location: Left Arm, Patient Position: Sitting, Cuff Size: Normal)    Pulse 67    Ht 5\' 3"  (1.6 m)    Wt 156 lb (70.8 kg)    SpO2 100%    BMI 27.63 kg/m  Wt Readings from Last 3 Encounters:  01/18/20 156 lb (70.8 kg)  09/17/19 160 lb 15 oz (73 kg)  08/06/19 160 lb (72.6 kg)    Physical Exam Exam conducted with a chaperone present.  Constitutional:      Appearance: She is well-developed and well-nourished.  HENT:     Head: Normocephalic.  Cardiovascular:     Rate and Rhythm: Normal rate and regular rhythm.     Heart sounds: Normal heart sounds.  Pulmonary:     Effort: Pulmonary effort is normal.     Breath sounds: Normal breath sounds.  Abdominal:     General: Bowel sounds are normal.     Palpations: Abdomen is soft.     Hernia: There is no hernia in the right inguinal area or left inguinal area.  Genitourinary:    Exam position: Lithotomy position.  Labia: No labial fusion.        Right: No rash, tenderness, lesion or injury.        Left: No rash, tenderness, lesion or injury.      Vagina: Normal. No signs of injury and foreign body. No vaginal discharge, erythema, tenderness or bleeding.     Cervix: No cervical motion tenderness or friability.     Uterus: Normal. Not deviated and not enlarged.      Adnexa:        Right: No mass, tenderness or fullness.         Left: No mass, tenderness or fullness.       Rectum: Normal. No external hemorrhoid.  Lymphadenopathy:     Lower Body: No right inguinal and no right inguinal adenopathy. No left inguinal and no left inguinal adenopathy.  Skin:    General: Skin is warm and dry.  Neurological:     Mental Status: She is alert and oriented to person, place,  and time.  Psychiatric:        Mood and Affect: Mood and affect normal.        Behavior: Behavior normal.        Thought Content: Thought content normal.        Judgment: Judgment normal.          Patient has been counseled extensively about nutrition and exercise as well as the importance of adherence with medications and regular follow-up. The patient was given clear instructions to go to ER or return to medical center if symptoms don't improve, worsen or new problems develop. The patient verbalized understanding.   Follow-up: Return in about 3 months (around 04/17/2020).   Claiborne Rigg, FNP-BC [facility] [place], [place] [phone]   01/18/2020, 3:48 PM

## 2020-01-20 LAB — CERVICOVAGINAL ANCILLARY ONLY
Bacterial Vaginitis (gardnerella): POSITIVE — AB
Candida Glabrata: NEGATIVE
Candida Vaginitis: POSITIVE — AB
Chlamydia: NEGATIVE
Comment: NEGATIVE
Comment: NEGATIVE
Comment: NEGATIVE
Comment: NEGATIVE
Comment: NEGATIVE
Comment: NORMAL
Neisseria Gonorrhea: NEGATIVE
Trichomonas: NEGATIVE

## 2020-01-23 ENCOUNTER — Other Ambulatory Visit: Payer: Self-pay | Admitting: Nurse Practitioner

## 2020-01-23 MED ORDER — FLUCONAZOLE 150 MG PO TABS: 150.0000 mg | ORAL_TABLET | Freq: Once | ORAL | 0 refills | Status: AC

## 2020-01-23 MED ORDER — METRONIDAZOLE 500 MG PO TABS: 500.0000 mg | ORAL_TABLET | Freq: Two times a day (BID) | ORAL | 0 refills | Status: AC

## 2020-02-17 ENCOUNTER — Telehealth: Payer: Self-pay | Admitting: Nurse Practitioner

## 2020-02-17 ENCOUNTER — Telehealth: Payer: Self-pay

## 2020-02-17 NOTE — Telephone Encounter (Signed)
Pt is inquiring if she could get a medication refilled for metroNIDAZOLE (FLAGYL) 500 MG tablet [[id number]] ENDED  at the [facility] listed or if an appt is needed for this?(Possibly with Dr. Cato Mulligan tomorrow?)

## 2020-02-17 NOTE — Telephone Encounter (Signed)
Copied from CRM [id number]. Topic: General - Other >> Feb 17, 2020 11:00 AM Jaquita Rector A wrote: Reason for CRM: Patient called in to speak to Loreen Freud stated that she have some concerns since her last visit and need to discuss please call Ph# [phone]

## 2020-02-22 NOTE — Telephone Encounter (Signed)
Attempt to reach patient. °No answer and LVM.  °

## 2020-04-18 ENCOUNTER — Other Ambulatory Visit: Payer: Self-pay

## 2020-04-18 ENCOUNTER — Ambulatory Visit: Payer: No Typology Code available for payment source | Attending: Nurse Practitioner | Admitting: Nurse Practitioner

## 2021-02-04 NOTE — L&D Delivery Note (Signed)
Delivery Note   Raechelle, Sarti [[id number]]  At 6:03 PM a viable female was delivered via Vaginal, Spontaneous (Presentation:Vertex).  APGAR: 4, 3   [place], Chauncy Lean [[id number]]  At 6:13 PM a viable female was delivered via Vaginal, Breech (Presentation: Complete Breech ).  APGAR: 4, 3  Both infants lived for a few minutes after birth given previablity.  Placenta status: intact, 3VC with the following complications: Trailing membranes, delivered with help of bimanual exploration and use of small Banjo curette.  Anesthesia:  Epidural Episiotomy: None Lacerations: None Est. Blood Loss (mL): 584  Mom to [facility] after recovery on L&D. Appropriate support given to her and family.     Jaynie Collins, MD, FACOG Obstetrician & Gynecologist, [facility] [employer], [facility]

## 2021-02-28 ENCOUNTER — Other Ambulatory Visit: Payer: Self-pay

## 2021-02-28 ENCOUNTER — Ambulatory Visit (HOSPITAL_COMMUNITY)
Admission: EM | Admit: 2021-02-28 | Discharge: 2021-02-28 | Disposition: A | Discharge status: Home / Self Care | Payer: No Typology Code available for payment source | Attending: Family Medicine | Admitting: Family Medicine

## 2021-02-28 ENCOUNTER — Encounter (HOSPITAL_COMMUNITY): Payer: Self-pay | Admitting: *Deleted

## 2021-02-28 DIAGNOSIS — J069 Acute upper respiratory infection, unspecified: Secondary | ICD-10-CM | POA: Diagnosis not present

## 2021-02-28 DIAGNOSIS — N750 Cyst of Bartholin's gland: Secondary | ICD-10-CM | POA: Diagnosis not present

## 2021-02-28 DIAGNOSIS — Z20822 Contact with and (suspected) exposure to covid-19: Secondary | ICD-10-CM | POA: Diagnosis not present

## 2021-02-28 LAB — POC INFLUENZA A AND B ANTIGEN (URGENT CARE ONLY)
INFLUENZA A ANTIGEN, POC: NEGATIVE
INFLUENZA B ANTIGEN, POC: NEGATIVE

## 2021-02-28 MED ORDER — AMOXICILLIN-POT CLAVULANATE 875-125 MG PO TABS: 1.0000 | ORAL_TABLET | Freq: Two times a day (BID) | ORAL | 0 refills | Status: AC

## 2021-02-28 NOTE — ED Provider Notes (Addendum)
[place]    CSN: [id number] Arrival date & time: 02/28/21  1825      History   Chief Complaint Chief Complaint  Patient presents with   Nausea   Headache   Chills   Generalized Body Aches    HPI Stacy Collier is a 31 y.o. female.    Headache Here for history of nausea and vomiting, chills, and nasal congestion since January 22.  She threw up once that day, and then has not thrown up since.  She is had her last bowel movement actually today, and was not diarrhea.  She is still very nauseated today.  She has been feeling some discomfort on her right posterior labia majora.  First it was burning lingering headache but no longer.  Last menstrual period was 30 January 6  Past Medical History:  Diagnosis Date   Anxiety    Depression     There are no problems to display for this patient.   Past Surgical History:  Procedure Laterality Date   WISDOM TOOTH EXTRACTION      OB History   No obstetric history on file.      Home Medications    Prior to Admission medications   Medication Sig Start Date End Date Taking? Authorizing Provider  amoxicillin-clavulanate (AUGMENTIN) 875-125 MG tablet Take 1 tablet by mouth 2 (two) times daily for 7 days. 02/28/21 03/07/21 Yes Barrett Henle, MD  ELDERBERRY PO Take by mouth.    [provider]  zinc gluconate 50 MG tablet Take 50 mg by mouth daily.    [provider]    Family History Family History  Problem Relation Age of Onset   Anxiety disorder Mother    Schizophrenia Maternal Aunt    Cancer Paternal Grandmother     Social History Social History   Tobacco Use   Smoking status: Never   Smokeless tobacco: Never  Substance Use Topics   Alcohol use: Not Currently    Alcohol/week: 0.0 standard drinks   Drug use: Never     Allergies   Patient has no known allergies.   Review of Systems Review of Systems  Neurological:  Positive for headaches.    Physical Exam Triage  Vital Signs ED Triage Vitals  Enc Vitals Group     BP 02/28/21 1855 120/81     Pulse Rate 02/28/21 1855 93     Resp 02/28/21 1855 18     Temp 02/28/21 1855 98.6 F (37 C)     Temp src --      SpO2 02/28/21 1855 93 %     Weight --      Height --      Head Circumference --      Peak Flow --      Pain Score 02/28/21 1851 5     Pain Loc --      Pain Edu? --      Excl. in [place]? --    No data found.  Updated Vital Signs BP 120/81    Pulse 93    Temp 98.6 F (37 C)    Resp 18    LMP 02/02/2021    SpO2 93%   Visual Acuity Right Eye Distance:   Left Eye Distance:   Bilateral Distance:    Right Eye Near:   Left Eye Near:    Bilateral Near:     Physical Exam Vitals reviewed.  Constitutional:      General: She is not in  acute distress.    Appearance: She is not toxic-appearing.  HENT:     Right Ear: Tympanic membrane and ear canal normal.     Left Ear: Tympanic membrane and ear canal normal.     Nose: Congestion present.     Mouth/Throat:     Mouth: Mucous membranes are moist.     Pharynx: No oropharyngeal exudate or posterior oropharyngeal erythema.  Eyes:     Extraocular Movements: Extraocular movements intact.     Conjunctiva/sclera: Conjunctivae normal.     Pupils: Pupils are equal, round, and reactive to light.  Cardiovascular:     Rate and Rhythm: Normal rate and regular rhythm.     Heart sounds: No murmur heard. Pulmonary:     Effort: Pulmonary effort is normal.     Breath sounds: Normal breath sounds. No stridor. No wheezing, rhonchi or rales.  Genitourinary:    Comments: There is mild swelling of her posterior labia majora, with probably a Bartholin's gland cyst evident, about 1.5 cm diameter. Tender Musculoskeletal:     Cervical back: Neck supple.  Lymphadenopathy:     Cervical: No cervical adenopathy.  Skin:    Capillary Refill: Capillary refill takes less than 2 seconds.     Coloration: Skin is not jaundiced or pale.  Neurological:     General: No focal  deficit present.     Mental Status: She is alert and oriented to person, place, and time.  Psychiatric:        Behavior: Behavior normal.     UC Treatments / Results  Labs (all labs ordered are listed, but only abnormal results are displayed) Labs Reviewed  SARS CORONAVIRUS 2 (TAT 6-24 HRS)  POC INFLUENZA A AND B ANTIGEN (URGENT CARE ONLY)    EKG   Radiology No results found.  Procedures Procedures (including critical care time)  Medications Ordered in UC Medications - No data to display  Initial Impression / Assessment and Plan / UC Course  I have reviewed the triage vital signs and the nursing notes.  Pertinent labs & imaging results that were available during my care of the patient were reviewed by me and considered in my medical decision making (see chart for details).     Flu test is negative.  Will swab for COVID; she is not at high risk for severe infection, so I would not do an oral antiviral.  Will treat the Bartholin's gland cyst with antibiotics, and she will call her gyn tomorrow. Final Clinical Impressions(s) / UC Diagnoses   Final diagnoses:  Viral upper respiratory tract infection  Bartholin's gland cyst     Discharge Instructions      Your flu test is negative.  You have been swabbed for COVID, and the test will result in the next 24 hours. Our staff will call you if positive. If the test is positive, you should quarantine for 5 days.   Take amoxicillin/clavulanic acid 875 mg, 1 tab twice daily with food for 7 days for the possible cellulitis infection in your bottom.  Also call your gynecologist for an appointment soon     ED Prescriptions     Medication Sig Dispense Auth. Provider   amoxicillin-clavulanate (AUGMENTIN) 875-125 MG tablet Take 1 tablet by mouth 2 (two) times daily for 7 days. 14 tablet Danford Tat, Janace Aris, MD      PDMP not reviewed this encounter.   Zenia Resides, MD 02/28/21 Willaim Sheng    Zenia Resides,  MD 02/28/21 2007

## 2021-02-28 NOTE — Discharge Instructions (Signed)
Your flu test is negative.  You have been swabbed for COVID, and the test will result in the next 24 hours. Our staff will call you if positive. If the test is positive, you should quarantine for 5 days.   Take amoxicillin/clavulanic acid 875 mg, 1 tab twice daily with food for 7 days for the possible cellulitis infection in your bottom.  Also call your gynecologist for an appointment soon

## 2021-02-28 NOTE — ED Triage Notes (Signed)
PT had a neg COVID test.Pt reports N/V started on Sunday and Chills ,body aches and HA started on Monday. Possible infection on labia. Pt reports there was a scratch and now she does not know wants going on.

## 2021-03-01 LAB — SARS CORONAVIRUS 2 (TAT 6-24 HRS): SARS Coronavirus 2: NEGATIVE

## 2021-03-20 IMAGING — DX DG CHEST 1V PORT
1 series · 1 of 1 positions shown · non-contrast
Comparison: Report from chest radiographs 05/02/1999 (images
unavailable).

CLINICAL DATA: Provided history: COVID, chest pain. Additional
history provided: Patient diagnosed with COVID last night, reports
fatigue, muscle aches, weakness, shortness of breath, vomiting.

EXAM:
PORTABLE CHEST 1 VIEW

[chest]
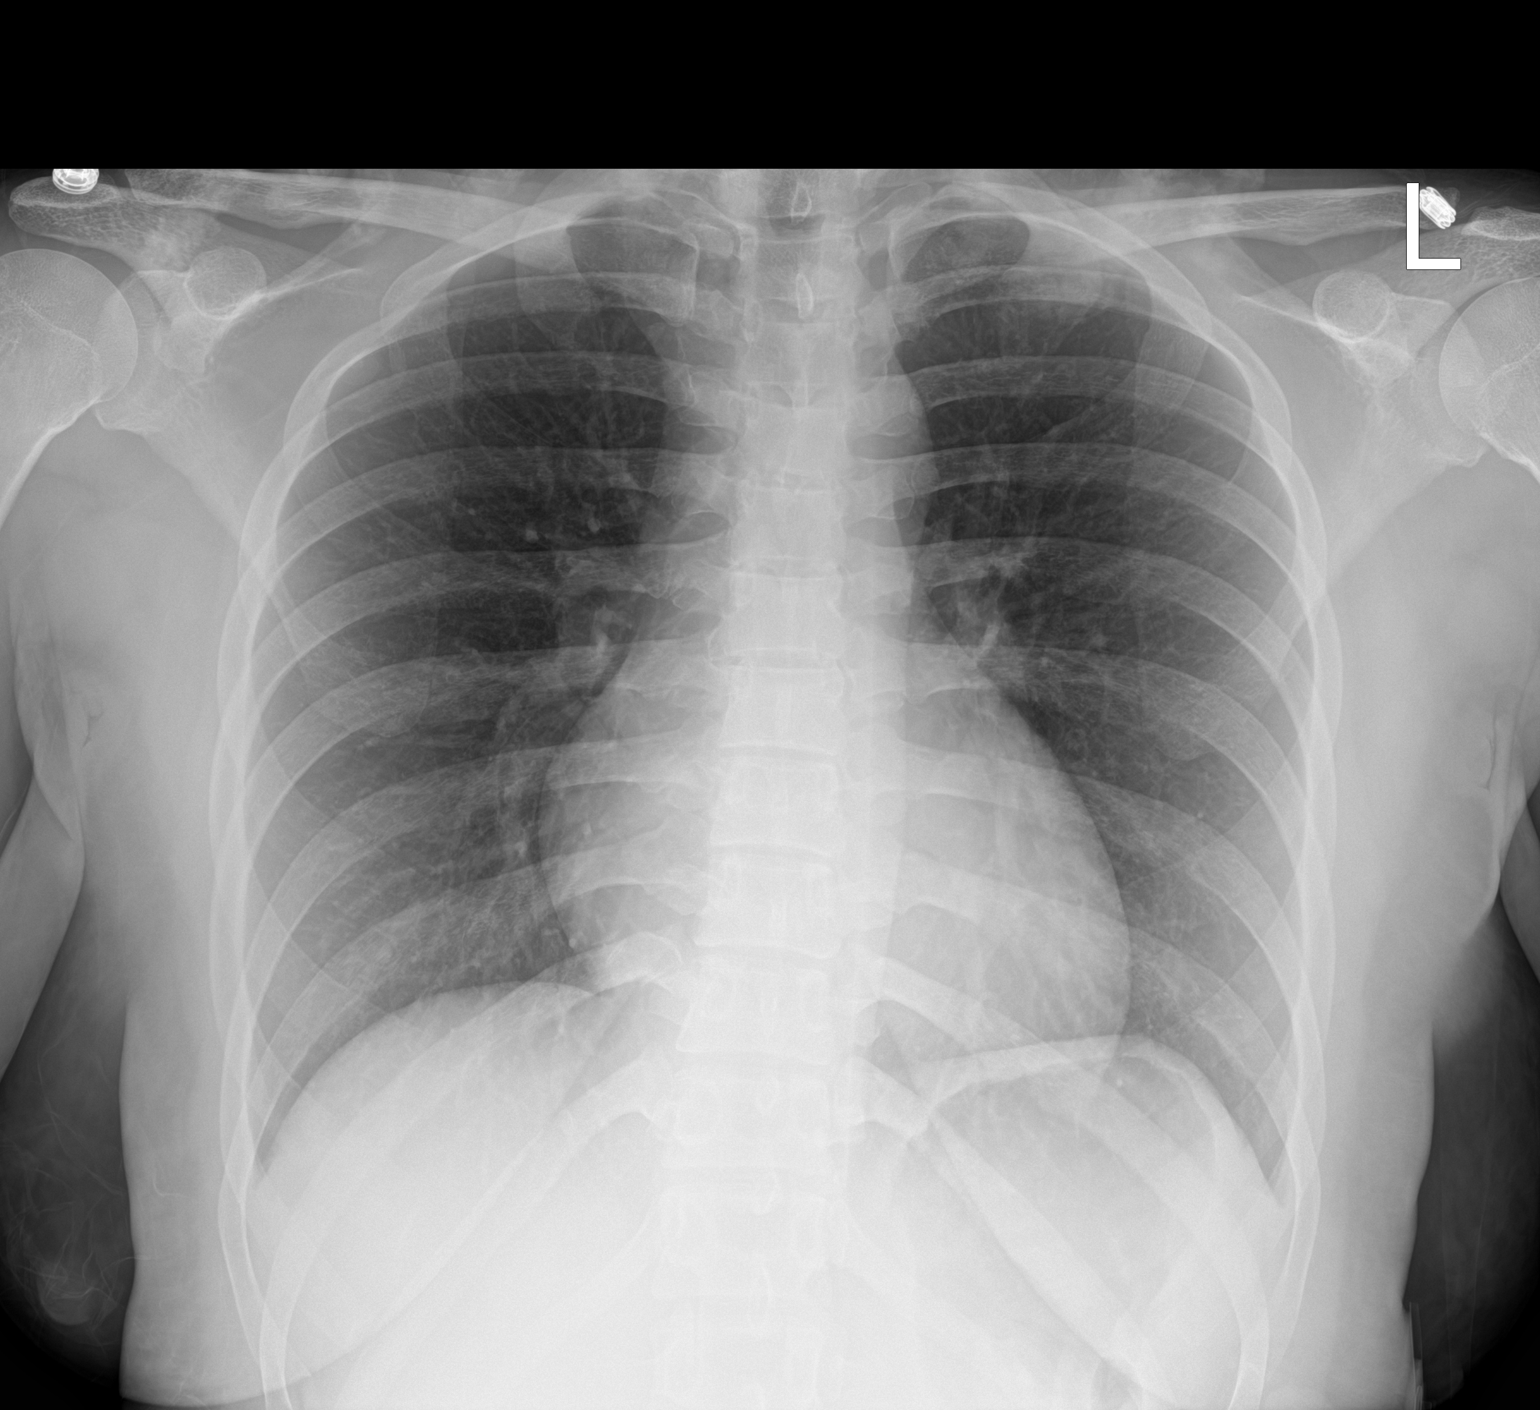

[1 of 1 positions shown; findings below may reference images not displayed]

FINDINGS: Heart size within normal limits. No appreciable airspace
consolidation. No evidence of pleural effusion or pneumothorax. No
acute bony abnormality identified.
IMPRESSION: No evidence of active cardiopulmonary disease.

## 2021-10-24 LAB — HEPATITIS C ANTIBODY: HCV Ab: NEGATIVE

## 2021-10-24 LAB — OB RESULTS CONSOLE HIV ANTIBODY (ROUTINE TESTING): HIV: NONREACTIVE

## 2021-10-24 LAB — OB RESULTS CONSOLE HEPATITIS B SURFACE ANTIGEN: Hepatitis B Surface Ag: NEGATIVE

## 2021-10-24 LAB — OB RESULTS CONSOLE ABO/RH: RH Type: POSITIVE

## 2021-10-24 LAB — OB RESULTS CONSOLE RPR: RPR: NONREACTIVE

## 2021-10-24 LAB — OB RESULTS CONSOLE HGB/HCT, BLOOD
HCT: 34 (ref 29–41)
Hemoglobin: 10.8

## 2021-10-24 LAB — OB RESULTS CONSOLE ANTIBODY SCREEN: Antibody Screen: NEGATIVE

## 2021-10-24 LAB — OB RESULTS CONSOLE RUBELLA ANTIBODY, IGM: Rubella: IMMUNE

## 2021-10-24 LAB — OB RESULTS CONSOLE PLATELET COUNT: Platelets: 358

## 2021-11-07 ENCOUNTER — Other Ambulatory Visit: Payer: Self-pay | Admitting: Obstetrics and Gynecology

## 2021-11-07 DIAGNOSIS — Z363 Encounter for antenatal screening for malformations: Secondary | ICD-10-CM

## 2021-12-06 ENCOUNTER — Telehealth (INDEPENDENT_AMBULATORY_CARE_PROVIDER_SITE_OTHER): Payer: No Typology Code available for payment source

## 2021-12-06 DIAGNOSIS — O0990 Supervision of high risk pregnancy, unspecified, unspecified trimester: Secondary | ICD-10-CM | POA: Insufficient documentation

## 2021-12-06 DIAGNOSIS — Z3490 Encounter for supervision of normal pregnancy, unspecified, unspecified trimester: Secondary | ICD-10-CM | POA: Insufficient documentation

## 2021-12-06 DIAGNOSIS — O30032 Twin pregnancy, monochorionic/diamniotic, second trimester: Secondary | ICD-10-CM

## 2021-12-06 DIAGNOSIS — F32A Depression, unspecified: Secondary | ICD-10-CM | POA: Insufficient documentation

## 2021-12-06 DIAGNOSIS — Z3689 Encounter for other specified antenatal screening: Secondary | ICD-10-CM

## 2021-12-06 DIAGNOSIS — F419 Anxiety disorder, unspecified: Secondary | ICD-10-CM

## 2021-12-06 DIAGNOSIS — O30002 Twin pregnancy, unspecified number of placenta and unspecified number of amniotic sacs, second trimester: Secondary | ICD-10-CM | POA: Insufficient documentation

## 2021-12-06 DIAGNOSIS — Z3480 Encounter for supervision of other normal pregnancy, unspecified trimester: Secondary | ICD-10-CM

## 2021-12-06 NOTE — Progress Notes (Addendum)
New OB Intake  I connected with Launa S. Trachtenberg on 12/06/21 at 11:15 AM EDT by MyChart Video Visit and verified that I am speaking with the correct person using two identifiers. Nurse is located at [facility] and pt is located at home.  I discussed the limitations, risks, security and privacy concerns of performing an evaluation and management service by telephone and the availability of in person appointments. I also discussed with the patient that there may be a patient responsible charge related to this service. The patient expressed understanding and agreed to proceed.  I explained I am completing New OB Intake today. We discussed EDD of 06/01/2022 that is based on LMP of 07/22/203. Pt is G2/P0. I reviewed her allergies, medications, Medical/Surgical/OB history, and appropriate screenings. I informed her of [facility] services. [facility] information placed in AVS. Based on history, this is a low risk pregnancy.  Patient Active Problem List   Diagnosis Date Noted   Supervision of other normal pregnancy, antepartum 12/06/2021   Anxiety 12/06/2021   Depression 12/06/2021   Twin gestation in second trimester 12/06/2021     Concerns addressed today  Delivery Plans Plans to deliver at [facility]. Patient given information for [facility] Healthy Baby website for more information about Women's and [place]. Patient is interested in water birth. Offered upcoming OB visit with CNM to discuss further.  MyChart/Babyscripts MyChart access verified. I explained pt will have some visits in office and some virtually. Babyscripts instructions given and order placed. Patient verifies receipt of registration text/e-mail. Account successfully created and app downloaded.  Blood Pressure Cuff/Weight Scale Patient has private insurance; instructed to purchase blood pressure cuff and bring to first prenatal appt. Explained after first prenatal appt pt will check weekly and document in 35. Patient does not have  weight scale; patient may purchase if they desire to track weight weekly in Babyscripts.  Anatomy US Explained first scheduled [place] will be around 19 weeks. Anatomy US scheduled for 01/07/2022 at 2:15 PM. Pt notified to arrive at 2:30 PM.  Labs Discussed Johnsie Cancel genetic screening with patient. Would like both Panorama and Horizon drawn at new OB visit. Routine prenatal labs needed.  COVID Vaccine Patient has not had COVID vaccine.   Is patient a CenteringPregnancy candidate?  Declined Declined due to schedules.     Is patient a Mom+Baby Combined Care candidate?  Not a candidate  due to twin gestation   Social Determinants of Health Food Insecurity: Patient denies food insecurity. WIC Referral: Patient is interested in referral to [facility].  Transportation: Patient denies transportation needs. Childcare: Discussed no children allowed at ultrasound appointments. Offered childcare services; patient declines childcare services at this time.  First visit review I reviewed new OB appt with patient. I explained they will have a provider visit that includes initial ob labs, genetic screening, AFP, and may require pelvic exam. Explained pt will be seen by Gaylan Gerold CNM at first visit; encounter routed to appropriate provider. Explained that patient will be seen by pregnancy navigator following visit with provider.   Mariane Baumgarten, [place] 12/06/2021  11:32 AM

## 2021-12-07 NOTE — BH Specialist Note (Unsigned)
Integrated Behavioral Health via Telemedicine Visit  12/11/2021 Stacy Collier [id number]  Number of [place] Clinician visits: 1- Initial Visit  Session Start time: 5102   Session End time: 5852  Total time in minutes: 60   Referring Provider: Gaylan Gerold, CNM Patient/Family location: Home [facility] Provider location: [facility] [place] [facility]  All persons participating in visit: Patient Stacy Collier  and [place]   Types of Service: Individual psychotherapy and Video visit  I connected with [place] and/or [place]  via  Telephone or Video Enabled Telemedicine Application  (Video is Caregility application) and verified that I am speaking with the correct person using two identifiers. Discussed confidentiality: Yes   I discussed the limitations of telemedicine and the availability of in person appointments.  Discussed there is a possibility of technology failure and discussed alternative modes of communication if that failure occurs.  I discussed that engaging in this telemedicine visit, they consent to the provision of behavioral healthcare and the services will be billed under their insurance.  Patient and/or legal guardian expressed understanding and consented to Telemedicine visit: Yes   Presenting Concerns: Patient and/or family reports the following symptoms/concerns: Feelings of depression and anxiety that come and go, while adjusting to positive twin pregnancy months after engagement, as well as difficulty having to be dependent on others, and fear of childbirth.  Duration of problem: Current pregnancy; Severity of problem: moderate  Patient and/or Family's Strengths/Protective Factors: Social connections, Concrete supports in place (healthy food, safe environments, etc.), and Sense of purpose  Goals Addressed: Patient will:  Reduce symptoms of: anxiety, depression, and  stress   Increase knowledge and/or ability of: healthy habits   Demonstrate ability to: Increase healthy adjustment to current life circumstances  Progress towards Goals: Ongoing  Interventions: Interventions utilized:  Functional Assessment of ADLs, Psychoeducation and/or Health Education, Link to [employer], and Supportive Reflection Standardized Assessments completed: GAD-7 and PHQ 9  Patient and/or Family Response: Patient agrees with treatment plan.   Assessment: Patient currently experiencing Adjustment disorder with mixed anxious and depressed mood.   Patient may benefit from psychoeducation and brief therapeutic interventions regarding coping with symptoms of depression, anxiety, stress .  Plan: Follow up with behavioral health clinician on : Two weeks Behavioral recommendations:  -Continue taking prenatal vitamin daily -Read through After Visit Summary information; use as needed -Prioritize healthy self-care (regular meals, adequate rest; allowing practical help from supportive friends and family)  Referral(s): [place] (In Clinic)  I discussed the assessment and treatment plan with the patient and/or parent/guardian. They were provided an opportunity to ask questions and all were answered. They agreed with the plan and demonstrated an understanding of the instructions.   They were advised to call back or seek an in-person evaluation if the symptoms worsen or if the condition fails to improve as anticipated.  [place], LCSW      12/11/2021    3:03 PM 01/18/2020    3:24 PM 08/06/2019    3:40 PM 04/03/2018    7:30 AM  Depression screen PHQ 2/9  Decreased Interest 1 0 0   Down, Depressed, Hopeless 1 0 0   PHQ - 2 Score 2 0 0   Altered sleeping 1 0 0   Tired, decreased energy 3 0 1   Change in appetite 1 0 0   Feeling bad or failure about yourself  1 0 0  Trouble concentrating 0 0 0   Moving slowly or fidgety/restless  1 0 0   Suicidal thoughts 0 0 0   PHQ-9 Score 9 0 1      Information is confidential and restricted. Go to Review Flowsheets to unlock data.      12/11/2021    3:17 PM 01/18/2020    3:23 PM 08/06/2019    3:41 PM  GAD 7 : Generalized Anxiety Score  Nervous, Anxious, on Edge 1 0 2  Control/stop worrying 0 0 2  Worry too much - different things 1 0 2  Trouble relaxing 0 0 2  Restless 0 0 0  Easily annoyed or irritable 2 0 3  Afraid - awful might happen 1 0 0  Total GAD 7 Score 5 0 11

## 2021-12-10 ENCOUNTER — Telehealth: Payer: Self-pay | Admitting: Family Medicine

## 2021-12-10 NOTE — Telephone Encounter (Signed)
Call placed to pt. Spoke with pt. Pt states having nasal congestion, running nose  and sore throat this morning. Has not been exposed to flu or covid.  Has taken Tylenol for headache, but didn't help. Does not have CHTN or GHTN.  Sent patient safe meds list to mychart.  Pt also advised to go to urgent care to for covid and flu testing. Pt verbalized understanding.   Colletta [place], RNC

## 2021-12-10 NOTE — Telephone Encounter (Signed)
Patient does not feel well would like a nurse to give her a call about what medications she can take prior to her next appt

## 2021-12-11 ENCOUNTER — Ambulatory Visit (INDEPENDENT_AMBULATORY_CARE_PROVIDER_SITE_OTHER): Payer: No Typology Code available for payment source | Admitting: Clinical

## 2021-12-11 DIAGNOSIS — F4323 Adjustment disorder with mixed anxiety and depressed mood: Secondary | ICD-10-CM

## 2021-12-11 NOTE — Patient Instructions (Signed)
[facility] [address] [place], [place] [address] [phone] (main office) [phone] (Sayid Moll's office)  [email or web address]       BRAINSTORMING  Develop a Plan Goals: Provide a way to start conversation about your new life with a baby Assist parents in recognizing and using resources within their reach Help pave the way before birth for an easier period of transition afterwards.  Make a list of the following information to keep in a central location: Full name of Mom and Partner: _____________________________________________ Baby's full name and Date of Birth: ___________________________________________ Home Address: ___________________________________________________________ ________________________________________________________________________ Home Phone: ____________________________________________________________ Parents' cell numbers: _____________________________________________________ ________________________________________________________________________ Name and contact info for OB: ______________________________________________ Name and contact info for Pediatrician:________________________________________ Contact info for Lactation Consultants: ________________________________________  REST and SLEEP *You each need at least 4-5 hours of uninterrupted sleep every day. Write specific names and contact information.* How are you going to rest in the postpartum period? While partner's home? When partner returns to work? When you both return to work? Where will your baby sleep? Who is available to help during the day? Evening? Night? Who could move in for a period to help support you? What are some ideas to help you get enough  sleep? __________________________________________________________________________________________________________________________________________________________________________________________________________________________________________ NUTRITIOUS FOOD AND DRINK *Plan for meals before your baby is born so you can have healthy food to eat during the immediate postpartum period.* Who will look after breakfast? Lunch? Dinner? List names and contact information. Brainstorm quick, healthy ideas for each meal. What can you do before baby is born to prepare meals for the postpartum period? How can others help you with meals? Which grocery stores provide online shopping and delivery? Which restaurants offer take-out or delivery options? ______________________________________________________________________________________________________________________________________________________________________________________________________________________________________________________________________________________________________________________________________________________________________________________________________  CARE FOR MOM *It's important that mom is cared for and pampered in the postpartum period. Remember, the most important ways new mothers need care are: sleep, nutrition, gentle exercise, and time off.* Who can come take care of mom during this period? Make a list of people with their contact information. List some activities that make you feel cared for, rested, and energized? Who can make sure you have opportunities to do these things? Does mom have a space of her very own within your home that's just for her? Make a "Mama Cave" where she can be comfortable, rest, and renew herself  daily. ______________________________________________________________________________________________________________________________________________________________________________________________________________________________________________________________________________________________________________________________________________________________________________________________________    CARE FOR AND FEEDING BABY *Knowledgeable and encouraging people will offer the best support with regard to feeding your baby.* Educate yourself and choose the best feeding option for your baby. Make a list of people who will guide, support, and be a resource for you as your care for and feed your baby. (Friends that have breastfed or are currently breastfeeding, lactation consultants, breastfeeding support groups, etc.) Consider a postpartum doula. (These websites can give you information: dona.org & padanc.org) Seek out local breastfeeding resources like the breastfeeding support group at [facility] or [employer]. ______________________________________________________________________________________________________________________________________________________________________________________________________________________________________________________________________________________________________________________________________________________________________________________________________  CHORES AND ERRANDS Who can help with a thorough cleaning before baby is born? Make a list of people who will help with housekeeping and chores, like laundry, light cleaning, dishes, bathrooms, etc. Who can run some errands for you? What can you do to make sure you are stocked with basic supplies before baby is born? Who is going to do the  shopping? ______________________________________________________________________________________________________________________________________________________________________________________________________________________________________________________________________________________________________________________________________________________________________________________________________     Family Adjustment *Nurture yourselves.it helps parents be more loving and allows for better bonding with their child.* What sorts of things do   doing together? Which activities help you to connect and strengthen your relationship? Make a list of those things. Make a list of people whom you trust to care for your baby so you can have some time together as a couple. What types of things help partner feel connected to Mom? Make a list. What needs will partner have in order to bond with baby? Other children? Who will care for them when you go into labor and while you are in the hospital? Think about what the needs of your older children might be. Who can help you meet those needs? In what ways are you helping them prepare for bringing baby home? List some specific strategies you have for family adjustment. _______________________________________________________________________________________________________________________________________________________________________________________________________________________________________________________________________________________________________________________________________________  SUPPORT *Someone who can empathize with experiences normalizes your problems and makes them more bearable.* Make a list of other friends, neighbors, and/or co-workers you know with infants (and small children, if applicable) with whom you can connect. Make a list of local or online support groups, mom groups, etc. in which you can be  involved. ______________________________________________________________________________________________________________________________________________________________________________________________________________________________________________________________________________________________________________________________________________________________________________________________________  Childcare Plans Investigate and plan for childcare if mom is returning to work. Talk about mom's concerns about her transition back to work. Talk about partner's concerns regarding this transition.  Mental Health *Your mental health is one of the highest priorities for a pregnant or postpartum mom.* 1 in 5 women experience anxiety and/or depression from the time of conception through the first year after birth. Postpartum Mood Disorders are the #1 complication of pregnancy and childbirth and the suffering experienced by these mothers is not necessary! These illnesses are temporary and respond well to treatment, which often includes self-care, social support, talk therapy, and medication when needed. Women experiencing anxiety and depression often say things like: "I'm supposed to be happy.why do I feel so sad?", "Why can't I snap out of it?", "I'm having thoughts that scare me." There is no need to be embarrassed if you are feeling these symptoms: Overwhelmed, anxious, angry, sad, guilty, irritable, hopeless, exhausted but can't sleep You are NOT alone. You are NOT to blame. With help, you WILL be well. Where can I find help? Medical professionals such as your OB, midwife, gynecologist, family practitioner, primary care provider, pediatrician, or mental health providers; [facility] support groups: Feelings After Birth, Breastfeeding Support Group, Baby and Me Group, and Fit 4 Two exercise classes. You have permission to ask for help. It will confirm your feelings, validate your experiences,  share/learn coping strategies, and gain support and encouragement as you heal. You are important! BRAINSTORM Make a list of local resources, including resources for mom and for partner. Identify support groups. Identify people to call late at night - include names and contact info. Talk with partner about perinatal mood and anxiety disorders. Talk with your OB, midwife, and doula about baby blues and about perinatal mood and anxiety disorders. Talk with your pediatrician about perinatal mood and anxiety disorders.   Support & Sanity Savers   What do you really need?  Basics In preparing for a new baby, many expectant parents spend hours shopping for baby clothes, decorating the nursery, and deciding which car seat to buy. Yet most don't think much about what the reality of parenting a newborn will be like, and what they need to make it through that. So, here is the advice of experienced parents. We know you'll read this, and think "they're exaggerating, I don't really need that." Just trust us on these, OK? Plan for all of   this, and if it turns out you don't need it, come back and teach us how you did it!  Must-Haves (Once baby's survival needs are met, make sure you attend to your own survival needs!) Sleep An average newborn sleeps 16-18 hours per day, over 6-7 sleep periods, rarely more than three hours at a time. It is normal and healthy for a newborn to wake throughout the night... but really hard on parents!! Naps. Prioritize sleep above any responsibilities like: cleaning house, visiting friends, running errands, etc.  Sleep whenever baby sleeps. If you can't nap, at least have restful times when baby eats. The more rest you get, the more patient you will be, the more emotionally stable, and better at solving problems.  Food You may not have realized it would be difficult to eat when you have a newborn. Yet, when we talk to countless new parents, they say things like "it may be 2:00 pm  when I realize I haven't had breakfast yet." Or "every time we sit down to dinner, baby needs to eat, and my food gets cold, so I don't bother to eat it." Finger food. Before your baby is born, stock up with one months' worth of food that: 1) you can eat with one hand while holding a baby, 2) doesn't need to be prepped, 3) is good hot or cold, 4) doesn't spoil when left out for a few hours, and 5) you like to eat. Think about: nuts, dried fruit, Clif bars, pretzels, jerky, gogurt, baby carrots, apples, bananas, crackers, cheez-n-crackers, string cheese, hot pockets or frozen burritos to microwave, garden burgers and breakfast pastries to put in the toaster, yogurt drinks, etc. Restaurant Menus. Make lists of your favorite restaurants & menu items. When family/friends want to help, you can give specific information without much thought. They can either bring you the food or send gift cards for just the right meals. Freezer Meals.  Take some time to make a few meals to put in the freezer ahead of time.  Easy to freeze meals can be anything such as soup, lasagna, chicken pie, or spaghetti sauce. Set up a Meal Schedule.  Ask friends and family to sign up to bring you meals during the first few weeks of being home. (It can be passed around at baby showers!) You have no idea how helpful this will be until you are in the throes of parenting.  www.takethemameal.com is a great website to check out. Emotional Support Know who to call when you're stressed out. Parenting a newborn is very challenging work. There are times when it totally overwhelms your normal coping abilities. EVERY NEW PARENT NEEDS TO HAVE A PLAN FOR WHO TO CALL WHEN THEY JUST CAN'T COPE ANY MORE. (And it has to be someone other than the baby's other parent!) Before your baby is born, come up with at least one person you can call for support - write their phone number down and post it on the refrigerator. Anxiety & Sadness. Baby blues are normal after  pregnancy; however, there are more severe types of anxiety & sadness which can occur and should not be ignored.  They are always treatable, but you have to take the first step by reaching out for help. [facility] offers a "Mom Talk" group which meets every Tuesday from 10 am - 11 am.  This group is for new moms who need support and connection after their babies are born.  Call [phone].  Really, Really Helpful (Plan for them!   Make sure these happen often!!) Physical Support with Taking Care of Yourselves Asking friends and family. Before your baby is born, set up a schedule of people who can come and visit and help out (or ask a friend to schedule for you). Any time someone says "let me know what I can do to help," sign them up for a day. When they get there, their job is not to take care of the baby (that's your job and your joy). Their job is to take care of you!  Postpartum doulas. If you don't have anyone you can call on for support, look into postpartum doulas:  professionals at helping parents with caring for baby, caring for themselves, getting breastfeeding started, and helping with household tasks. www.padanc.org is a helpful website for learning about doulas in our area. Peer Support / Parent Groups Why: One of the greatest ideas for new parents is to be around other new parents. Parent groups give you a chance to share and listen to others who are going through the same season of life, get a sense of what is normal infant development by watching several babies learn and grow, share your stories of triumph and struggles with empathetic ears, and forgive your own mistakes when you realize all parents are learning by trial and error. Where to find: There are many places you can meet other new parents throughout our community.  [facility] offers the following classes for new moms and their [place] ones:  Baby and Me (Birth to Crawling) and Breastfeeding Support Group. Go to  www.[email or web address] or call [phone] for more information. Time for your Relationship It's easy to get so caught up in meeting baby's immediate needs that it's hard to find time to connect with your partner, and meet the needs of your relationship. It's also easy to forget what "quality time with your partner" actually looks like. If you take your baby on a date, you'd be amazed how much of your couple time is spent feeding the baby, diapering the baby, admiring the baby, and talking about the baby. Dating: Try to take time for just the two of you. Babysitter tip: Sometimes when moms are breastfeeding a newborn, they find it hard to figure out how to schedule outings around baby's unpredictable feeding schedules. Have the babysitter come for a three hour period. When she comes over, if baby has just eaten, you can leave right away, and come back in two hours. If baby hasn't fed recently, you start the date at home. Once baby gets hungry and gets a good feeding in, you can head out for the rest of your date time. Date Nights at Home: If you can't get out, at least set aside one evening a week to prioritize your relationship: whenever baby dozes off or doesn't have any immediate needs, spend a [place] time focusing on each other. Potential conflicts: The main relationship conflicts that come up for new parents are: issues related to sexuality, financial stresses, a feeling of an unfair division of household tasks, and conflicts in parenting styles. The more you can work on these issues before baby arrives, the better!  Fun and Frills (Don't forget these. and don't feel guilty for indulging in them!) Everyone has something in life that is a fun F[place]ler treat that they do just for themselves. It may be: reading the morning paper, or going for a daily jog, or having coffee with a friend once a week, or going to a movie on Friday nights,   or fine chocolates, or bubble baths, or curling up with a good  book. Unless you do fun things for yourself every now and then, it's hard to have the energy for fun with your baby. Whatever your "special" treats are, make sure you find a way to continue to indulge in them after your baby is born. These special moments can recharge you, and allow you to return to baby with a new joy   PERINATAL MOOD DISORDERS: MATERNAL MENTAL HEALTH FROM CONCEPTION THROUGH THE POSTPARTUM PERIOD   _________________________________________Emergency and Crisis Resources If you are an imminent risk to self or others, are experiencing intense personal distress, and/or have noticed significant changes in activities of daily living, call:  911 [facility]: [phone]  [address], [place], [place], [address] Mobile Crisis: 877-626-1772 National Suicide Hotline: 988 Or visit the following crisis centers: Local Emergency Departments [facility]: [address], [place]  [phone]. Hours: 8:30AM-5PM. Insurance Accepted: Medicaid, Medicare, and Uninsured.  [facility]:  [address], [place]  Mon-Friday 8am-3pm, [phone]                                                                                  ___________ Non-Crisis Resources To identify specific providers that are covered by your insurance, contact your insurance company or local agencies:  [facility]: 1-800-256-2452 [facility]: 888-581-9988 [employer] [facility]: 1-800-939-5911 Postpartum Support International- Warm-line: 1-800-944-4773                                                      __Outpatient Therapy and Medication Management   Providers:  [facility]: [phone] Hours: 9AM-5PM  Insurance Accepted: AARP, Aetna, BCBS, Cigna, [employer], Humana, Medicare  [facility] [phone] Hours: 8AM-5:30PM  nsurance Accepted: All insurances EXCEPT AARP, Aetna,  [employer], and Humana [facility]: [phone] Hours: 8AM-8PM Insurance Accepted: Aetna, BCBS, Cigna, [employer], Medicaid, Medicare, Uninsured [facility]: [phone] [facility]: [phone] Hours: 8:30AM-7PM Insurance Accepted: Aetna, BCBS, Medicaid, Medicare, Tricare, United Healthcare [facility]:  [phone]   Hours:9AM-5PM Insurance Accepted:  Aetna, BCBS, [facility], Medicaid, United Health Care  [facility]: [phone] Hours: 9AM-5:30PM Insurance Accepted: AARP, Aetna, BCBS, Cigna, and Medicaid, Medicare, United Health Care [facility]:  [phone] Hours: 9am-5pm Insurance Accepted: BCBS; they do not accept Medicaid/Medicare [facility]: [phone] Hours: 9am-9pm Insurance Accepted: All major insurance including Medicaid and Medicare [facility]: [phone] Hours: 9AM- 5PM Insurance Accepted: All insurances EXCEPT Medicaid and Medicare. [facility]: [phone]   ____________                                                                       Parenting Support Groups [facility]: [phone] [facility]:  [phone] Family Support Network: (support for children in the NICU and/or with special needs), [phone]   ___________                                                                 Mental Health Support Groups Mental Health Association: [phone]    _____________                                                                                  Online Resources Postpartum Support International: http://www.postpartum.net/  800-944-4PPD 2Moms Supporting Moms:  www.momssupportingmoms.net    

## 2021-12-12 NOTE — BH Specialist Note (Unsigned)
Integrated Behavioral Health via Telemedicine Visit  12/12/2021 Stacy Collier [id number]  Number of Integrated Behavioral Health Clinician visits: 1- Initial Visit  Session Start time: 1450   Session End time: 1550  Total time in minutes: 60   Referring Provider: Edd Arbour, CNM Patient/Family location: Home  [facility] Provider location: [facility]  All persons participating in visit: Patient Stacy Collier and [facility] Stacy Collier    Types of Service: Individual psychotherapy and Video visit  I connected with Stacy Collier and/or Stacy Collier's  n/a  via  Telephone or Video Enabled Telemedicine Application  (Video is Caregility application) and verified that I am speaking with the correct person using two identifiers. Discussed confidentiality: Yes   I discussed the limitations of telemedicine and the availability of in person appointments.  Discussed there is a possibility of technology failure and discussed alternative modes of communication if that failure occurs.  I discussed that engaging in this telemedicine visit, they consent to the provision of behavioral healthcare and the services will be billed under their insurance.  Patient and/or legal guardian expressed understanding and consented to Telemedicine visit: Yes   Presenting Concerns: Patient and/or family reports the following symptoms/concerns: Primary concern today is feeling anxious about first medical appointment in pregnancy, along with concern about migraine that began in pregnancy*** Duration of problem: Current pregnancy; Severity of problem: moderate  Patient and/or Family's Strengths/Protective Factors: {CHL AMB BH PROTECTIVE FACTORS:[phone]}  Goals Addressed: Patient will:  Reduce symptoms of: {IBH Symptoms:21014056}   Increase knowledge and/or ability of: {IBH Patient Tools:21014057}   Demonstrate ability to: {IBH  Goals:21014053}  Progress towards Goals: Ongoing  Interventions: Interventions utilized:  {IBH Interventions:21014054} Standardized Assessments completed: {IBH Screening Tools:21014051}  Patient and/or Family Response: Patient agrees with treatment plan. ***  Assessment: Patient currently experiencing ***.   Patient may benefit from continued therapeutic interventions ***.  Plan: Follow up with behavioral health clinician on : *** Behavioral recommendations:  -*** -*** Referral(s): {IBH Referrals:21014055}  I discussed the assessment and treatment plan with the patient and/or parent/guardian. They were provided an opportunity to ask questions and all were answered. They agreed with the plan and demonstrated an understanding of the instructions.   They were advised to call back or seek an in-person evaluation if the symptoms worsen or if the condition fails to improve as anticipated.  Stacy Close Yazmin Locher, LCSW     12/11/2021    3:03 PM 01/18/2020    3:24 PM 08/06/2019    3:40 PM 04/03/2018    7:30 AM  Depression screen PHQ 2/9  Decreased Interest 1 0 0   Down, Depressed, Hopeless 1 0 0   PHQ - 2 Score 2 0 0   Altered sleeping 1 0 0   Tired, decreased energy 3 0 1   Change in appetite 1 0 0   Feeling bad or failure about yourself  1 0 0   Trouble concentrating 0 0 0   Moving slowly or fidgety/restless 1 0 0   Suicidal thoughts 0 0 0   PHQ-9 Score 9 0 1      Information is confidential and restricted. Go to Review Flowsheets to unlock data.      12/11/2021    3:17 PM 01/18/2020    3:23 PM 08/06/2019    3:41 PM  GAD 7 : Generalized Anxiety Score  Nervous, Anxious, on Edge 1 0 2  Control/stop worrying 0 0 2  Worry too much - different things 1 0 2  Trouble relaxing 0 0 2  Restless 0 0 0  Easily annoyed or irritable 2 0 3  Afraid - awful might happen 1 0 0  Total GAD 7 Score 5 0 11

## 2021-12-25 ENCOUNTER — Ambulatory Visit: Payer: No Typology Code available for payment source | Admitting: Clinical

## 2021-12-25 DIAGNOSIS — F4323 Adjustment disorder with mixed anxiety and depressed mood: Secondary | ICD-10-CM

## 2022-01-04 ENCOUNTER — Other Ambulatory Visit (HOSPITAL_COMMUNITY)
Admission: RE | Admit: 2022-01-04 | Discharge: 2022-01-04 | Disposition: A | Discharge status: Home / Self Care | Payer: No Typology Code available for payment source | Source: Ambulatory Visit | Attending: Certified Nurse Midwife | Admitting: Certified Nurse Midwife

## 2022-01-04 ENCOUNTER — Encounter: Payer: Self-pay | Admitting: Certified Nurse Midwife

## 2022-01-04 ENCOUNTER — Ambulatory Visit (INDEPENDENT_AMBULATORY_CARE_PROVIDER_SITE_OTHER): Payer: No Typology Code available for payment source | Admitting: Certified Nurse Midwife

## 2022-01-04 VITALS — BP 119/74 | HR 80 | Wt 187.6 lb

## 2022-01-04 DIAGNOSIS — Z3A18 18 weeks gestation of pregnancy: Secondary | ICD-10-CM | POA: Diagnosis not present

## 2022-01-04 DIAGNOSIS — F32A Depression, unspecified: Secondary | ICD-10-CM | POA: Diagnosis not present

## 2022-01-04 DIAGNOSIS — F419 Anxiety disorder, unspecified: Secondary | ICD-10-CM

## 2022-01-04 DIAGNOSIS — R519 Headache, unspecified: Secondary | ICD-10-CM | POA: Diagnosis not present

## 2022-01-04 DIAGNOSIS — O26892 Other specified pregnancy related conditions, second trimester: Secondary | ICD-10-CM

## 2022-01-04 DIAGNOSIS — Z3A19 19 weeks gestation of pregnancy: Secondary | ICD-10-CM | POA: Diagnosis not present

## 2022-01-04 DIAGNOSIS — Z3492 Encounter for supervision of normal pregnancy, unspecified, second trimester: Secondary | ICD-10-CM | POA: Diagnosis not present

## 2022-01-04 DIAGNOSIS — O30032 Twin pregnancy, monochorionic/diamniotic, second trimester: Secondary | ICD-10-CM | POA: Diagnosis not present

## 2022-01-04 DIAGNOSIS — M79606 Pain in leg, unspecified: Secondary | ICD-10-CM

## 2022-01-04 DIAGNOSIS — O0932 Supervision of pregnancy with insufficient antenatal care, second trimester: Secondary | ICD-10-CM | POA: Insufficient documentation

## 2022-01-04 NOTE — Patient Instructions (Signed)
Stacy Collier w/ [facility] [address] [place], [place] [address] [phone] [email or web address]  

## 2022-01-06 LAB — CULTURE, OB URINE

## 2022-01-06 LAB — URINE CULTURE, OB REFLEX

## 2022-01-06 MED ORDER — FLUTICASONE PROPIONATE 50 MCG/ACT NA SUSP: 1.0000 | Freq: Every day | NASAL | 2 refills | Status: DC

## 2022-01-06 MED ORDER — MAG-OXIDE 200 MG PO TABS: 400.0000 mg | ORAL_TABLET | Freq: Every day | ORAL | 3 refills | Status: DC

## 2022-01-06 NOTE — Progress Notes (Signed)
History:   Stacy Collier is a 31 y.o. G2P0010 at [redacted]w[redacted]d by LMP being seen today for her first obstetrical visit.  Her obstetrical history is significant for  none . Patient does intend to breast feed. Pregnancy history fully reviewed.  Patient reports intermittent sharp pain in her lower leg (the entire lower leg, not just her calf). Does not feel like cramping, just sharp and makes it difficult for her to walk. Has to wait for the sensation to pass before getting up to walk. Missed work due to the pain. Also having low back pain. Has been having migraines, tylenol has not helped. Starting to get better.   HISTORY: OB History  Gravida Para Term Preterm AB Living  2 0 0 0 1 0  SAB IAB Ectopic Multiple Live Births  0 1 0 0 0    # Outcome Date GA Lbr Len/2nd Weight Sex Delivery Anes PTL Lv  2 Current           1 IAB 2019 [redacted]w[redacted]d           Last pap smear was done 01/2020 and was normal  Past Medical History:  Diagnosis Date   Anxiety    Depression    Past Surgical History:  Procedure Laterality Date   WISDOM TOOTH EXTRACTION     Family History  Problem Relation Age of Onset   Anxiety disorder Mother    Schizophrenia Maternal Aunt    Cancer Paternal Grandmother    Social History   Tobacco Use   Smoking status: Never   Smokeless tobacco: Never  Substance Use Topics   Alcohol use: Not Currently    Alcohol/week: 0.0 standard drinks of alcohol   Drug use: Never   No Known Allergies Current Outpatient Medications on File Prior to Visit  Medication Sig Dispense Refill   Acetaminophen (TYLENOL 8 HOUR PO) Take by mouth.     Prenatal Vit-Fe Fumarate-FA (PRENATAL PO) Take by mouth.     ELDERBERRY PO Take by mouth. (Patient not taking: Reported on 01/04/2022)     Ferrous Sulfate (IRON PO) Take by mouth. (Patient not taking: Reported on 01/04/2022)     Ondansetron HCl (ZOFRAN PO) Take by mouth. (Patient not taking: Reported on 01/04/2022)     Pyridoxine HCl (B-6 PO) Take by mouth.  (Patient not taking: Reported on 01/04/2022)     zinc gluconate 50 MG tablet Take 50 mg by mouth daily. (Patient not taking: Reported on 12/06/2021)     No current facility-administered medications on file prior to visit.    Review of Systems Pertinent items noted in HPI and remainder of comprehensive ROS otherwise negative. Physical Exam:   Vitals:   01/04/22 0938  Weight: 187 lb 9.6 oz (85.1 kg)   Fetal Heart Rate (bpm): 154/138  Constitutional: Well-developed, well-nourished pregnant female in no acute distress.  HEENT: PERRLA Skin: normal color and turgor, no rash Cardiovascular: normal rate & rhythm Respiratory: normal effort GI: Abd soft, non-tender MS: Extremities nontender, no edema, normal ROM Neurologic: Alert and oriented x 4.  GU: no CVA tenderness Pelvic: exam deferred  Assessment:    Pregnancy: G2P0010 Patient Active Problem List   Diagnosis Date Noted   Supervision of low-risk pregnancy 12/06/2021   Anxiety 12/06/2021   Depression 12/06/2021   Twin gestation in second trimester 12/06/2021     Plan:  1. Encounter for supervision of low-risk pregnancy in second trimester - Feeling regular fetal movement - GC/Chlamydia probe amp ([place])not at  [facility] - Culture, OB Urine  2. [redacted] weeks gestation of pregnancy - Routine OB care   3. Monochorionic diamniotic twin gestation in second trimester - Being followed by MFM  4. Anxiety and depression - Seeing our [facility] team  5. Pain of lower extremity, unspecified laterality - Discussed stretching, use of maternity band, magnesium at night and encouraged her to see her chiropractor - No s/sx DVT - calf sizes match, no redness/warmth/tenderness to palpation  6. Headache in pregnancy - Discussed importance of hydration, eating, Tylenol+caffeine at first hint of headache, use of magnesium at night and Flonase (she has noted mild congestion and thinks that is worsening the headaches) - Can refer to headache  specialist if this continues.  6. Initial obstetric visit in second trimester - Initial labs already completed, need abstraction - Continue prenatal vitamins. - Problem list reviewed and updated. - Genetic Screening discussed: already completed, need records/abstraction - Ultrasound discussed; fetal anatomic survey: ordered. - Anticipatory guidance about prenatal visits given including labs, ultrasounds, and testing. - Discussed usage of Babyscripts and virtual visits as additional source of managing and completing prenatal visits in midst of coronavirus and pandemic.   - Encouraged to complete MyChart Registration for her ability to review results, send requests, and have questions addressed.  - The nature of [facility] with multiple MDs and Advanced Practice Providers was explained to patient; also emphasized that residents, students are part of our team. - Routine obstetric precautions reviewed. Encouraged to seek out care at office or emergency room [facility] preferred) for urgent and/or emergent concerns.  Return for IN-PERSON, HOB.    Edd Arbour, MSN, CNM, IBCLC Certified Nurse Midwife, [facility]

## 2022-01-07 ENCOUNTER — Ambulatory Visit: Payer: PRIVATE HEALTH INSURANCE | Admitting: *Deleted

## 2022-01-07 ENCOUNTER — Other Ambulatory Visit: Payer: No Typology Code available for payment source

## 2022-01-07 ENCOUNTER — Encounter: Payer: Self-pay | Admitting: *Deleted

## 2022-01-07 ENCOUNTER — Other Ambulatory Visit: Payer: Self-pay | Admitting: Obstetrics and Gynecology

## 2022-01-07 ENCOUNTER — Ambulatory Visit: Payer: PRIVATE HEALTH INSURANCE | Attending: Obstetrics and Gynecology

## 2022-01-07 ENCOUNTER — Ambulatory Visit: Payer: No Typology Code available for payment source | Attending: Obstetrics | Admitting: Obstetrics

## 2022-01-07 VITALS — BP 104/66 | HR 77

## 2022-01-07 DIAGNOSIS — O30032 Twin pregnancy, monochorionic/diamniotic, second trimester: Secondary | ICD-10-CM | POA: Diagnosis not present

## 2022-01-07 DIAGNOSIS — O99112 Other diseases of the blood and blood-forming organs and certain disorders involving the immune mechanism complicating pregnancy, second trimester: Secondary | ICD-10-CM

## 2022-01-07 DIAGNOSIS — O283 Abnormal ultrasonic finding on antenatal screening of mother: Secondary | ICD-10-CM

## 2022-01-07 DIAGNOSIS — O3432 Maternal care for cervical incompetence, second trimester: Secondary | ICD-10-CM | POA: Diagnosis not present

## 2022-01-07 DIAGNOSIS — Z862 Personal history of diseases of the blood and blood-forming organs and certain disorders involving the immune mechanism: Secondary | ICD-10-CM | POA: Diagnosis not present

## 2022-01-07 DIAGNOSIS — E669 Obesity, unspecified: Secondary | ICD-10-CM

## 2022-01-07 DIAGNOSIS — Z3492 Encounter for supervision of normal pregnancy, unspecified, second trimester: Secondary | ICD-10-CM | POA: Insufficient documentation

## 2022-01-07 DIAGNOSIS — O99212 Obesity complicating pregnancy, second trimester: Secondary | ICD-10-CM | POA: Insufficient documentation

## 2022-01-07 DIAGNOSIS — Z363 Encounter for antenatal screening for malformations: Secondary | ICD-10-CM | POA: Insufficient documentation

## 2022-01-07 DIAGNOSIS — Z3A19 19 weeks gestation of pregnancy: Secondary | ICD-10-CM

## 2022-01-07 DIAGNOSIS — O99012 Anemia complicating pregnancy, second trimester: Secondary | ICD-10-CM | POA: Diagnosis not present

## 2022-01-07 DIAGNOSIS — D573 Sickle-cell trait: Secondary | ICD-10-CM

## 2022-01-07 DIAGNOSIS — D649 Anemia, unspecified: Secondary | ICD-10-CM

## 2022-01-07 LAB — GC/CHLAMYDIA PROBE AMP (~~LOC~~) NOT AT ARMC
Chlamydia: NEGATIVE
Comment: NEGATIVE
Comment: NORMAL
Neisseria Gonorrhea: NEGATIVE

## 2022-01-07 MED ORDER — PROGESTERONE 200 MG PO CAPS: ORAL_CAPSULE | ORAL | 0 refills | Status: DC

## 2022-01-08 ENCOUNTER — Other Ambulatory Visit: Payer: Self-pay | Admitting: *Deleted

## 2022-01-08 ENCOUNTER — Encounter: Payer: Self-pay | Admitting: *Deleted

## 2022-01-08 DIAGNOSIS — O30032 Twin pregnancy, monochorionic/diamniotic, second trimester: Secondary | ICD-10-CM

## 2022-01-08 DIAGNOSIS — Z3492 Encounter for supervision of normal pregnancy, unspecified, second trimester: Secondary | ICD-10-CM

## 2022-01-08 DIAGNOSIS — O26872 Cervical shortening, second trimester: Secondary | ICD-10-CM

## 2022-01-08 NOTE — BH Specialist Note (Signed)
Error; pt hospitalized; rescheduled

## 2022-01-08 NOTE — Progress Notes (Signed)
MFM Note  Stacy Collier was seen due to a spontaneously conceived twin pregnancy.  This is her first pregnancy.    She denies any significant past medical history and denies any problems in her current pregnancy.  She denies prior surgeries to her cervix.  She has not had any screening tests for fetal aneuploidy drawn in her current pregnancy.  A thin dividing membrane was noted separating the two fetuses along with a single placenta, indicating that these are monochorionic, diamniotic twins.  The fetal growth and amniotic fluid level appeared appropriate for her gestational age for twin A.    The overall EFW for twin B measured at the 3rd percentile for her gestational age, indicating possible IUGR.  There was only a 2 ounce difference noted between the EFW's of twin A and twin B.  There were no obvious fetal anomalies seen in twin A.  Possible echogenic bowel was noted in twin B.  The limitations of ultrasound although since the detection of anomalies was discussed.   A transvaginal ultrasound performed today shows cervical funneling .  The cervical stroma still appears to be thick and long.  A digital exam performed today shows that her cervix is closed.  The following were discussed during today's consultation:  Monochorionic, diamniotic twin gestation  The implications and management of monochorionic twins was discussed.   The 10% to 15% risk of twin to twin transfusion syndrome (TTTS) seen in monochorionic, diamniotic twins was discussed today.  She was reassured that there were no signs of TTTS noted today.  The implications and management of twin to TTTS should she develop this complication was also discussed.  She was advised that we will continue to follow her closely with serial ultrasounds to assess for signs of TTTS.  She was advised that management of twin pregnancies will involve frequent ultrasound exams to assess the fetal growth and amniotic fluid level.    We  will continue to follow her with ultrasound exams every 2 weeks to assess for signs of TTTS.    Weekly fetal testing should be started at around 32 weeks.    Delivery for uncomplicated monochorionic twins is recommended at around 37 weeks.  The increased risk of preeclampsia, gestational diabetes, and preterm birth/labor associated with twin pregnancies was discussed.    As pregnancies with multiple gestations are at increased risk for developing preeclampsia, she was advised to start taking 2 tablets of baby aspirin daily (81 mg each) to decrease her risk of developing preeclampsia.   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  She was advised that as she does not have a history of a prior preterm birth and as the appearance of the cervix still appears to be thick and long, I believe that her risk of a preterm birth is still low.  Management options for cervical funneling in a twin pregnancy including continued expectant management, daily vaginal progesterone, and a cervical cerclage were discussed.  The patient and her partner decided to try daily vaginal progesterone for 1 week to see if the cervical funneling disappears with the daily vaginal progesterone treatment.    As more recent studies have indicated that a higher dose of vaginal progesterone may be beneficial in preventing preterm birth in twin pregnancies, she was advised to place 2 tablets of Prometrium (200 mg each) in her vagina at bedtime daily.  She was given a note for work to  work remotely at home.  A follow-up transvaginal cervical length measurement was scheduled in our office in 1 week.    The couple will consider a cerclage placement next week should further cervical funneling/shortening be noted at that time.  Preterm labor precautions were reviewed.  She was advised to go to the hospital should she complain of contractions or lower  abdominal cramping.  Possible echogenic bowel noted in twin B  The causes of echogenic bowel including a normal variant, fetal aneuploidy, swallowed  blood, cystic fibrosis, and viral infections were discussed.  She denies any recent vaginal bleeding.   Due to the echogenic bowel noted today, the patient was offered and declined an amniocentesis for definitive diagnosis of fetal aneuploidy.    The couple will consider having a cell free DNA test and blood test for CMV and toxoplasmosis infections drawn when she returns for her ultrasound next week.  The couple stated that they were provided with an overwhelming amount of information today.  However, they understood everything that was discussed with them today.  They stated that all of her questions had been answered.  A total of 60 minutes was spent counseling and coordinating the care for this patient.  Greater than 50% of the time was spent in direct face-to-face contact.

## 2022-01-15 ENCOUNTER — Ambulatory Visit: Payer: No Typology Code available for payment source | Attending: Obstetrics

## 2022-01-15 ENCOUNTER — Encounter: Payer: Self-pay | Admitting: *Deleted

## 2022-01-15 ENCOUNTER — Ambulatory Visit: Payer: No Typology Code available for payment source | Attending: Obstetrics | Admitting: Obstetrics

## 2022-01-15 ENCOUNTER — Other Ambulatory Visit: Payer: Self-pay | Admitting: Obstetrics

## 2022-01-15 ENCOUNTER — Ambulatory Visit: Payer: No Typology Code available for payment source | Admitting: *Deleted

## 2022-01-15 VITALS — BP 106/60 | HR 84

## 2022-01-15 DIAGNOSIS — O30032 Twin pregnancy, monochorionic/diamniotic, second trimester: Secondary | ICD-10-CM

## 2022-01-15 DIAGNOSIS — O321XX1 Maternal care for breech presentation, fetus 1: Secondary | ICD-10-CM | POA: Diagnosis not present

## 2022-01-15 DIAGNOSIS — O26872 Cervical shortening, second trimester: Secondary | ICD-10-CM | POA: Diagnosis not present

## 2022-01-15 DIAGNOSIS — Z3A20 20 weeks gestation of pregnancy: Secondary | ICD-10-CM

## 2022-01-15 DIAGNOSIS — O321XX2 Maternal care for breech presentation, fetus 2: Secondary | ICD-10-CM | POA: Insufficient documentation

## 2022-01-15 DIAGNOSIS — Z363 Encounter for antenatal screening for malformations: Secondary | ICD-10-CM | POA: Insufficient documentation

## 2022-01-15 DIAGNOSIS — Z3686 Encounter for antenatal screening for cervical length: Secondary | ICD-10-CM | POA: Insufficient documentation

## 2022-01-15 DIAGNOSIS — O99212 Obesity complicating pregnancy, second trimester: Secondary | ICD-10-CM | POA: Insufficient documentation

## 2022-01-15 DIAGNOSIS — Z148 Genetic carrier of other disease: Secondary | ICD-10-CM

## 2022-01-15 DIAGNOSIS — Z3492 Encounter for supervision of normal pregnancy, unspecified, second trimester: Secondary | ICD-10-CM

## 2022-01-15 DIAGNOSIS — O3432 Maternal care for cervical incompetence, second trimester: Secondary | ICD-10-CM | POA: Diagnosis not present

## 2022-01-15 DIAGNOSIS — O99012 Anemia complicating pregnancy, second trimester: Secondary | ICD-10-CM | POA: Insufficient documentation

## 2022-01-15 DIAGNOSIS — Z862 Personal history of diseases of the blood and blood-forming organs and certain disorders involving the immune mechanism: Secondary | ICD-10-CM | POA: Insufficient documentation

## 2022-01-15 NOTE — Progress Notes (Signed)
MFM Note  Stacy Collier was seen due to a monochorionic, diamniotic twin gestation with cervical funneling noted on her last ultrasound exam 1 week ago.  She has been using daily vaginal progesterone (Prometrium 400 mg daily) for the past week.  She denies any lower abdominal cramping or contractions.  On today's exam, there was normal amniotic fluid noted around both twin A and twin B.  There were no signs of twin to twin transfusion syndrome noted today.  A transvaginal ultrasound performed today shows significant funneling of the cervix.  The gestational sac appears to funnel all the way down to the internal cervical os.  The cervical stroma still appears to be long and thick.  A digital exam performed today shows that her cervix is still closed.  The increased risk of a preterm/previable birth due to the funneled cervix was discussed.    Management options for a funneled cervix including continued treatment with daily vaginal progesterone versus a cerclage was discussed again today.  The patient has declined a cerclage placement and would like to continue daily vaginal progesterone treatment.  Preterm labor precautions were reviewed.  Pelvic rest was advised.  I will contact the patient's employer to have her work remotely 3 times a week.    Should she remain undelivered at 23 weeks (viability), we will plan on administering a complete course of antenatal corticosteroids.  We may also consider hospitalization on modified bedrest starting at 23 weeks until she reaches a more optimal gestational age (28 weeks).    She will return early next week for another cervical length and ultrasound exam to complete the views of the fetal anatomy.    Due to the monochorionic twin gestation, we will refer her to pediatric cardiology for a fetal echocardiogram after her next ultrasound exam next week.  The patient stated that all of her questions were answered today.  A total of 20 minutes was  spent counseling and coordinating the care for this patient.  Greater than 50% of the time was spent in direct face-to-face contact.

## 2022-01-19 ENCOUNTER — Inpatient Hospital Stay (HOSPITAL_COMMUNITY)
Admission: AD | Admit: 2022-01-19 | Discharge: 2022-01-23 | DRG: 805 | Disposition: A | Discharge status: Home / Self Care | Payer: PRIVATE HEALTH INSURANCE | Attending: Obstetrics & Gynecology | Admitting: Obstetrics & Gynecology

## 2022-01-19 ENCOUNTER — Encounter (HOSPITAL_COMMUNITY): Payer: Self-pay | Admitting: Obstetrics & Gynecology

## 2022-01-19 DIAGNOSIS — O30032 Twin pregnancy, monochorionic/diamniotic, second trimester: Secondary | ICD-10-CM | POA: Diagnosis present

## 2022-01-19 DIAGNOSIS — D62 Acute posthemorrhagic anemia: Secondary | ICD-10-CM | POA: Diagnosis not present

## 2022-01-19 DIAGNOSIS — O411290 Chorioamnionitis, unspecified trimester, not applicable or unspecified: Secondary | ICD-10-CM | POA: Diagnosis present

## 2022-01-19 DIAGNOSIS — Z0289 Encounter for other administrative examinations: Secondary | ICD-10-CM

## 2022-01-19 DIAGNOSIS — O42912 Preterm premature rupture of membranes, unspecified as to length of time between rupture and onset of labor, second trimester: Principal | ICD-10-CM | POA: Diagnosis present

## 2022-01-19 DIAGNOSIS — Z3A21 21 weeks gestation of pregnancy: Secondary | ICD-10-CM

## 2022-01-19 DIAGNOSIS — O411220 Chorioamnionitis, second trimester, not applicable or unspecified: Secondary | ICD-10-CM | POA: Diagnosis present

## 2022-01-19 DIAGNOSIS — O30002 Twin pregnancy, unspecified number of placenta and unspecified number of amniotic sacs, second trimester: Secondary | ICD-10-CM | POA: Diagnosis present

## 2022-01-19 DIAGNOSIS — O3432 Maternal care for cervical incompetence, second trimester: Secondary | ICD-10-CM | POA: Diagnosis present

## 2022-01-19 DIAGNOSIS — O42919 Preterm premature rupture of membranes, unspecified as to length of time between rupture and onset of labor, unspecified trimester: Principal | ICD-10-CM

## 2022-01-19 DIAGNOSIS — O0990 Supervision of high risk pregnancy, unspecified, unspecified trimester: Secondary | ICD-10-CM

## 2022-01-19 DIAGNOSIS — O321XX2 Maternal care for breech presentation, fetus 2: Secondary | ICD-10-CM | POA: Diagnosis present

## 2022-01-19 DIAGNOSIS — O9081 Anemia of the puerperium: Secondary | ICD-10-CM | POA: Diagnosis not present

## 2022-01-19 DIAGNOSIS — O4690 Antepartum hemorrhage, unspecified, unspecified trimester: Secondary | ICD-10-CM

## 2022-01-19 DIAGNOSIS — N883 Incompetence of cervix uteri: Secondary | ICD-10-CM

## 2022-01-19 NOTE — MAU Note (Signed)
.  Stacy Collier is a 31 y.o. at [redacted]w[redacted]d here in MAU reporting blood on tissue when wiping at 2030. Has gone to the BR couple of times more and has gotten lighter and pinker. No pain and no recent intercourse  Onset of complaint: 2030 Pain score: 0 Vitals:   01/19/22 2326 01/19/22 2329  BP:  100/74  Pulse: (!) 106   Resp: 17   Temp: 98.5 F (36.9 C)   SpO2: 98%      FHT:A-160, B165 Lab orders placed from triage:  u/a

## 2022-01-20 ENCOUNTER — Encounter (HOSPITAL_COMMUNITY): Payer: Self-pay | Admitting: Obstetrics & Gynecology

## 2022-01-20 DIAGNOSIS — O42919 Preterm premature rupture of membranes, unspecified as to length of time between rupture and onset of labor, unspecified trimester: Secondary | ICD-10-CM | POA: Diagnosis present

## 2022-01-20 DIAGNOSIS — D62 Acute posthemorrhagic anemia: Secondary | ICD-10-CM | POA: Diagnosis not present

## 2022-01-20 DIAGNOSIS — O411230 Chorioamnionitis, third trimester, not applicable or unspecified: Secondary | ICD-10-CM | POA: Diagnosis not present

## 2022-01-20 DIAGNOSIS — O3432 Maternal care for cervical incompetence, second trimester: Secondary | ICD-10-CM | POA: Diagnosis present

## 2022-01-20 DIAGNOSIS — O3433 Maternal care for cervical incompetence, third trimester: Secondary | ICD-10-CM | POA: Diagnosis present

## 2022-01-20 DIAGNOSIS — O42912 Preterm premature rupture of membranes, unspecified as to length of time between rupture and onset of labor, second trimester: Secondary | ICD-10-CM | POA: Diagnosis present

## 2022-01-20 DIAGNOSIS — O411220 Chorioamnionitis, second trimester, not applicable or unspecified: Secondary | ICD-10-CM | POA: Diagnosis present

## 2022-01-20 DIAGNOSIS — O42013 Preterm premature rupture of membranes, onset of labor within 24 hours of rupture, third trimester: Secondary | ICD-10-CM | POA: Diagnosis not present

## 2022-01-20 DIAGNOSIS — O9081 Anemia of the puerperium: Secondary | ICD-10-CM | POA: Diagnosis not present

## 2022-01-20 DIAGNOSIS — Z3A21 21 weeks gestation of pregnancy: Secondary | ICD-10-CM | POA: Diagnosis not present

## 2022-01-20 DIAGNOSIS — O321XX2 Maternal care for breech presentation, fetus 2: Secondary | ICD-10-CM | POA: Diagnosis present

## 2022-01-20 DIAGNOSIS — O30032 Twin pregnancy, monochorionic/diamniotic, second trimester: Secondary | ICD-10-CM | POA: Diagnosis present

## 2022-01-20 LAB — CBC WITH DIFFERENTIAL/PLATELET
Abs Immature Granulocytes: 0.1 10*3/uL — ABNORMAL HIGH (ref 0.00–0.07)
Basophils Absolute: 0 10*3/uL (ref 0.0–0.1)
Basophils Relative: 0 %
Eosinophils Absolute: 0.1 10*3/uL (ref 0.0–0.5)
Eosinophils Relative: 0 %
HCT: 29.3 % — ABNORMAL LOW (ref 36.0–46.0)
Hemoglobin: 10.4 g/dL — ABNORMAL LOW (ref 12.0–15.0)
Immature Granulocytes: 1 %
Lymphocytes Relative: 8 %
Lymphs Abs: 1.1 10*3/uL (ref 0.7–4.0)
MCH: 26.9 pg (ref 26.0–34.0)
MCHC: 35.5 g/dL (ref 30.0–36.0)
MCV: 75.7 fL — ABNORMAL LOW (ref 80.0–100.0)
Monocytes Absolute: 0.7 10*3/uL (ref 0.1–1.0)
Monocytes Relative: 5 %
Neutro Abs: 11.1 10*3/uL — ABNORMAL HIGH (ref 1.7–7.7)
Neutrophils Relative %: 86 %
Platelets: 262 10*3/uL (ref 150–400)
RBC: 3.87 MIL/uL (ref 3.87–5.11)
RDW: 17.5 % — ABNORMAL HIGH (ref 11.5–15.5)
WBC: 13.1 10*3/uL — ABNORMAL HIGH (ref 4.0–10.5)
nRBC: 0 % (ref 0.0–0.2)

## 2022-01-20 LAB — URINALYSIS, ROUTINE W REFLEX MICROSCOPIC
Bacteria, UA: NONE SEEN
Bilirubin Urine: NEGATIVE
Glucose, UA: NEGATIVE mg/dL
Ketones, ur: 80 mg/dL — AB
Nitrite: NEGATIVE
Protein, ur: 30 mg/dL — AB
Specific Gravity, Urine: 1.013 (ref 1.005–1.030)
pH: 5 (ref 5.0–8.0)

## 2022-01-20 LAB — TYPE AND SCREEN
ABO/RH(D): O POS
Antibody Screen: NEGATIVE

## 2022-01-20 MED ORDER — LACTATED RINGERS IV SOLN: 125.0000 mL/h | INTRAVENOUS | Status: AC

## 2022-01-20 MED ORDER — ACETAMINOPHEN 325 MG PO TABS
650.0000 mg | ORAL_TABLET | ORAL | Status: DC | PRN
  Administered 2022-01-20 – 2022-01-21 (×3): 650 mg via ORAL
  Filled 2022-01-20 (×3): qty 2

## 2022-01-20 MED ORDER — PRENATAL MULTIVITAMIN CH
1.0000 | ORAL_TABLET | Freq: Every day | ORAL | Status: DC
  Administered 2022-01-20: 1 via ORAL
  Filled 2022-01-20: qty 1

## 2022-01-20 MED ORDER — CALCIUM CARBONATE ANTACID 500 MG PO CHEW: 2.0000 | CHEWABLE_TABLET | ORAL | Status: DC | PRN

## 2022-01-20 MED ORDER — DOCUSATE SODIUM 100 MG PO CAPS
100.0000 mg | ORAL_CAPSULE | Freq: Every day | ORAL | Status: DC
  Administered 2022-01-20 – 2022-01-21 (×2): 100 mg via ORAL
  Filled 2022-01-20 (×2): qty 1

## 2022-01-20 NOTE — MAU Provider Note (Signed)
Chief Complaint:  Vaginal Bleeding   Event Date/Time   First Provider Initiated Contact with Patient 01/20/22 0000     HPI  HPI: Stacy Collier is a 31 y.o. G2P0010 at 62w1dwho presents to maternity admissions reporting ***. She reports good fetal movement, denies LOF, vaginal bleeding, vaginal itching/burning, urinary symptoms, h/a, dizziness, n/v, diarrhea, constipation or fever/chills.  She denies headache, visual changes or RUQ abdominal pain.   Past Medical History: Past Medical History:  Diagnosis Date   Anxiety    Depression     Past obstetric history: OB History  Gravida Para Term Preterm AB Living  2       1    SAB IAB Ectopic Multiple Live Births    1          # Outcome Date GA Lbr Len/2nd Weight Sex Delivery Anes PTL Lv  2 Current           1 IAB 2019 [redacted]w[redacted]d           Past Surgical History: Past Surgical History:  Procedure Laterality Date   WISDOM TOOTH EXTRACTION      Family History: Family History  Problem Relation Age of Onset   Anxiety disorder Mother    Schizophrenia Maternal Aunt    Cancer Paternal Grandmother     Social History: Social History   Tobacco Use   Smoking status: Never   Smokeless tobacco: Never  Substance Use Topics   Alcohol use: Not Currently    Alcohol/week: 0.0 standard drinks of alcohol   Drug use: Never    Allergies: No Known Allergies  Meds:  Medications Prior to Admission  Medication Sig Dispense Refill Last Dose   Prenatal Vit-Fe Fumarate-FA (PRENATAL PO) Take by mouth.   01/18/2022   progesterone (PROMETRIUM) 200 MG capsule Place two capsules (400 mg total) in vagina daily 60 capsule 0 Past Week   Acetaminophen (TYLENOL 8 HOUR PO) Take by mouth.      ELDERBERRY PO Take by mouth. (Patient not taking: Reported on 01/04/2022)      Ferrous Sulfate (IRON PO) Take by mouth. (Patient not taking: Reported on 01/04/2022)      fluticasone (FLONASE) 50 MCG/ACT nasal spray Place 1-2 sprays into both nostrils daily. 11.1  mL 2    Magnesium Oxide -Mg Supplement (MAG-OXIDE) 200 MG TABS Take 2 tablets (400 mg total) by mouth at bedtime. If that amount causes loose stools in the am, switch to 200mg  daily at bedtime. 60 tablet 3    Ondansetron HCl (ZOFRAN PO) Take by mouth. (Patient not taking: Reported on 01/04/2022)      Pyridoxine HCl (B-6 PO) Take by mouth.      zinc gluconate 50 MG tablet Take 50 mg by mouth daily. (Patient not taking: Reported on 12/06/2021)       I have reviewed patient's Past Medical Hx, Surgical Hx, Family Hx, Social Hx, medications and allergies.   ROS:  Review of Systems Other systems negative  Physical Exam  Patient Vitals for the past 24 hrs:  BP Temp Pulse Resp SpO2 Height Weight  01/19/22 2329 100/74 -- -- -- -- -- --  01/19/22 2326 -- 98.5 F (36.9 C) (!) 106 17 98 % 5\' 3"  (1.6 m) 85.3 kg   Constitutional: Well-developed, well-nourished female in no acute distress.  Cardiovascular: normal rate and rhythm Respiratory: normal effort, clear to auscultation bilaterally GI: Abd soft, non-tender, gravid appropriate for gestational age.   No rebound or guarding. MS: Extremities nontender,  no edema, normal ROM Neurologic: Alert and oriented x 4.  GU: Neg CVAT.  PELVIC EXAM: Cervix pink, visually closed, without lesion, scant white creamy discharge, vaginal walls and external genitalia normal Bimanual exam: Cervix firm, posterior, neg CMT, uterus nontender, Fundal Height consistent with dates, adnexa without tenderness, enlargement, or mass     FHT:  Baseline *** , moderate variability, accelerations present, no decelerations Contractions: q *** mins Irregular  Rare   Labs: No results found for this or any previous visit (from the past 24 hour(s)). O/Positive/-- (09/20 1543)  Imaging:  [place] MFM OB Transvaginal  Result Date: 01/15/2022 ----------------------------------------------------------------------  OBSTETRICS REPORT                       (Signed Final 01/15/2022 05:48  pm) ---------------------------------------------------------------------- Patient Info  ID #:       [id number]                          D.O.B.:  Mar 23, 1990 (31 yrs)  Name:       Stacy Collier               Visit Date: 01/15/2022 01:08 pm ---------------------------------------------------------------------- Performed By  Attending:        Johnell Comings MD         Secondary Phy.:   Everlene Farrier                                                             MD  Performed By:     Stephenie Acres        Address:          [facility]                                                             [facility]                                                             [place]  [id number]  Referred By:      [place]          Location:         [facility]                                [facility]                                                             [facility]                                                             [facility]  Ref. Address:     [address]                    [place] ---------------------------------------------------------------------- Orders  #  Description                           Code        Ordered By  1  [place] MFM OB TRANSVAGINAL                [phone]     Peterson Ao  2  [place] MFM OB LIMITED                     [id number]    Peterson Ao ----------------------------------------------------------------------  #  Order #                     Accession #                Episode #  1  [id number]                   [id number]                 [id number]  2  [id number]                   [id number]                 [id number] ---------------------------------------------------------------------- Indications  Twin pregnancy,  mono/di, second trimester      123456  Obesity complicating pregnancy, second         O99.212  trimester  Anemia during pregnancy in second trimester    O99.012  Encounter for cervical length                  Z36.86  History of sickle cell trait                   Z86.2  Encounter for antenatal screening for          Z36.3  malformations  [redacted]  weeks gestation of pregnancy                Z3A.20 ---------------------------------------------------------------------- Fetal Evaluation (Fetus A)  Num Of Fetuses:         2  Fetal Heart Rate(bpm):  149  Cardiac Activity:       Observed  Fetal Lie:              Lower RT  Presentation:           Breech  Placenta:               Anterior  P. Cord Insertion:      Previously Visualized  Membrane Desc:      Dividing Membrane seen - Monochorionic  Amniotic Fluid  AFI FV:      Within normal limits                              Largest Pocket(cm)                              4. ---------------------------------------------------------------------- Biometry (Fetus A)  LV:        6.1  mm ---------------------------------------------------------------------- OB History  Blood Type:   O+  Gravidity:    2         Term:   0        Prem:   0        SAB:   0  TOP:          1       Ectopic:  0        Living: 0 ---------------------------------------------------------------------- Gestational Age (Fetus A)  LMP:           20w 3d        Date:  08/25/21                  EDD:   06/01/22  Best:          Hyacinth Meeker 3d     Det. By:  LMP  (08/25/21)          EDD:   06/01/22 ---------------------------------------------------------------------- Anatomy (Fetus A)  Cranium:               Appears normal         LVOT:                   Previously seen  Cavum:                 Appears normal         Aortic Arch:            Appears normal  Ventricles:            Appears normal         Ductal Arch:            Previously seen  Choroid Plexus:        Previously seen        Diaphragm:              Appears normal  Cerebellum:             Previously seen        Stomach:                Appears normal, left  sided  Posterior Fossa:       Previously seen        Abdomen:                Previously seen  Nuchal Fold:           Previously seen        Abdominal Wall:         Previously seen  Face:                  Orbits appear          Cord Vessels:           Previously seen                         normal  Lips:                  Previously seen        Kidneys:                Appear normal  Palate:                Not well visualized    Bladder:                Appears normal  Thoracic:              Appears normal         Spine:                  Previously seen  Heart:                 Appears normal         Upper Extremities:      Previously seen                         (4CH, axis, and                         situs)  RVOT:                  Previously seen        Lower Extremities:      Previously seen ---------------------------------------------------------------------- Targeted Anatomy (Fetus A)  Head/Neck  Mandible:              Seen on prior scan     Maxilla:                Seen on prior scan  Thorax  SVC:                   Seen on prior scan     3 V Trachea View:       Seen on prior scan  Interventr. Septum:    Seen on prior scan     IVC:                    Seen on prior scan  3 Vessel View:         Seen on prior scan     Crossing:               Appears normal  Abdomen  Ventral Wall:          Seen on prior scan  Extremities  Lt Hand:  Seen on prior scan     Lt Foot:                Seen on prior scan  Rt Hand:               Seen on prior scan     Rt Foot:                Seen on prior scan  Other  Genitalia:             Female ---------------------------------------------------------------------- Fetal Evaluation (Fetus B)  Num Of Fetuses:         2  Fetal Heart Rate(bpm):  134  Cardiac Activity:       Observed  Fetal Lie:              Upper LT  Presentation:            Breech  Placenta:               Anterior  P. Cord Insertion:      Marginal insertion prev  Membrane Desc:      Dividing Membrane seen - Monochorionic  Amniotic Fluid  AFI FV:      Within normal limits                              Largest Pocket(cm)                              4.9 ---------------------------------------------------------------------- Biometry (Fetus B)  LV:        7.8  mm ---------------------------------------------------------------------- Gestational Age (Fetus B)  LMP:           20w 3d        Date:  08/25/21                  EDD:   06/01/22  Best:          Hyacinth Meeker 3d     Det. By:  LMP  (08/25/21)          EDD:   06/01/22 ---------------------------------------------------------------------- Anatomy (Fetus B)  Cranium:               Appears normal         LVOT:                   Not well visualized  Cavum:                 Appears normal         Aortic Arch:            Appears normal  Ventricles:            Appears normal         Ductal Arch:            Not well visualized  Choroid Plexus:        Previously seen        Diaphragm:              Appears normal  Cerebellum:            Previously seen        Stomach:                Appears normal, left  sided  Posterior Fossa:       Previously seen        Abdomen:                Not well visualized  Nuchal Fold:           Previously seen        Abdominal Wall:         Previously seen  Face:                  Orbits appear          Cord Vessels:           Previously seen                         normal  Lips:                  Previously seen        Kidneys:                Appear normal  Palate:                Not well visualized    Bladder:                Appears normal  Thoracic:              Appears normal         Spine:                  Previously seen  Heart:                 Appears normal         Upper Extremities:      Previously seen                         (4CH, axis, and                          situs)  RVOT:                  Not well visualized    Lower Extremities:      Previously seen  Other:  Technically difficult due to fetal position. ---------------------------------------------------------------------- Targeted Anatomy (Fetus B)  Thorax  SVC:                   Seen on prior scan     3 V Trachea View:       Not well visualized  3 Vessel View:         Not well visualized    IVC:                    Seen on prior scan  Extremities  Lt Hand:               Seen on prior scan     Lt Foot:                Seen on prior scan  Rt Hand:               Seen on prior scan     Rt Foot:                Seen on prior scan  Other  Genitalia:  Female ---------------------------------------------------------------------- Cervix Uterus Adnexa  Cervix  Cervical funneling ---------------------------------------------------------------------- Comments  Stacy Collier was seen due to a monochorionic, diamniotic  twin gestation with cervical funneling noted on her last  ultrasound exam 1 week ago.  She has been using daily  vaginal progesterone (Prometrium 400 mg daily) for the past  week.  She denies any lower abdominal cramping or  contractions.  On today's exam, there was normal amniotic fluid noted  around both twin A and twin B.  There were no signs of twin  to twin transfusion syndrome noted today.  A transvaginal ultrasound performed today shows significant  funneling of the cervix.  The gestational sac appears to funnel  all the way down to the internal cervical os.  The cervical stroma still appears to be long and thick.  A digital exam performed today shows that her cervix is still  closed.  The increased risk of a preterm/previable birth due to the  funneled cervix was discussed.  Management options for a funneled cervix including  continued treatment with daily vaginal progesterone versus a  cerclage was discussed again today.  The patient has declined a cerclage placement and would like   to continue daily vaginal progesterone treatment.  Preterm labor precautions were reviewed.  Pelvic rest was  advised.  I will contact the patient's employer to have her work remotely  3 times a week.  Should she remain undelivered at 23 weeks (viability), we will  plan on administering a complete course of antenatal  corticosteroids.  We may also consider hospitalization on  modified bedrest starting at 23 weeks until she reaches a  more optimal gestational age (5 weeks).  She will return early next week for another cervical length and  ultrasound exam to complete the views of the fetal anatomy.  Due to the monochorionic twin gestation, we will refer her to  pediatric cardiology for a fetal echocardiogram after her next  ultrasound exam next week.  The patient stated that all of her questions were answered  today.  A total of 20 minutes was spent counseling and coordinating  the care for this patient.  Greater than 50% of the time was  spent in direct face-to-face contact. ----------------------------------------------------------------------                   Johnell Comings, MD Electronically Signed Final Report   01/15/2022 05:48 pm ----------------------------------------------------------------------  [place] MFM OB LIMITED  Result Date: 01/15/2022 ----------------------------------------------------------------------  OBSTETRICS REPORT                       (Signed Final 01/15/2022 05:48 pm) ---------------------------------------------------------------------- Patient Info  ID #:       [id number]                          D.O.B.:  07/21/1990 (31 yrs)  Name:       Stacy Collier               Visit Date: 01/15/2022 01:08 pm ---------------------------------------------------------------------- Performed By  Attending:        Johnell Comings MD         Secondary Phy.:   Everlene Farrier  MD  Performed By:     Stephenie Acres        Address:          [facility]                                                             Women                                                             [place]  Referred By:      [place]          Location:         [facility]                                [facility] at                                                             [facility]                                                             Women  Ref. Address:     [address]                    [place], [place]  [address] ---------------------------------------------------------------------- Orders  #  Description                           Code        Ordered By  1  [place] MFM OB TRANSVAGINAL                [phone]     YU FANG  2  [place] MFM OB LIMITED                     [id number]    YU FANG ----------------------------------------------------------------------  #  Order #                     Accession #                Episode #  1  [id number]                   [id number]                 [id number]  2  [id number]                   [id number]                 [id number] ---------------------------------------------------------------------- Indications  Twin pregnancy, mono/di, second trimester      123456  Obesity complicating pregnancy, second         O99.212  trimester  Anemia during pregnancy in second trimester    O99.012  Encounter for cervical length                  Z36.86  History of sickle cell trait                   Z86.2  Encounter for antenatal screening for          Z36.3  malformations  [redacted] weeks gestation of pregnancy                Z3A.20 ---------------------------------------------------------------------- Fetal Evaluation (Fetus A)  Num Of Fetuses:         2  Fetal Heart  Rate(bpm):  149  Cardiac Activity:       Observed  Fetal Lie:              Lower RT  Presentation:           Breech  Placenta:               Anterior  P. Cord Insertion:      Previously Visualized  Membrane Desc:      Dividing Membrane seen - Monochorionic  Amniotic Fluid  AFI FV:      Within normal limits                              Largest Pocket(cm)                              4. ---------------------------------------------------------------------- Biometry (Fetus A)  LV:        6.1  mm ---------------------------------------------------------------------- OB History  Blood Type:   O+  Gravidity:    2         Term:   0        Prem:   0  SAB:   0  TOP:          1       Ectopic:  0        Living: 0 ---------------------------------------------------------------------- Gestational Age (Fetus A)  LMP:           20w 3d        Date:  08/25/21                  EDD:   06/01/22  Best:          Hyacinth Meeker 3d     Det. By:  LMP  (08/25/21)          EDD:   06/01/22 ---------------------------------------------------------------------- Anatomy (Fetus A)  Cranium:               Appears normal         LVOT:                   Previously seen  Cavum:                 Appears normal         Aortic Arch:            Appears normal  Ventricles:            Appears normal         Ductal Arch:            Previously seen  Choroid Plexus:        Previously seen        Diaphragm:              Appears normal  Cerebellum:            Previously seen        Stomach:                Appears normal, left                                                                        sided  Posterior Fossa:       Previously seen        Abdomen:                Previously seen  Nuchal Fold:           Previously seen        Abdominal Wall:         Previously seen  Face:                  Orbits appear          Cord Vessels:           Previously seen                         normal  Lips:                  Previously seen        Kidneys:                Appear normal   Palate:  Not well visualized    Bladder:                Appears normal  Thoracic:              Appears normal         Spine:                  Previously seen  Heart:                 Appears normal         Upper Extremities:      Previously seen                         (4CH, axis, and                         situs)  RVOT:                  Previously seen        Lower Extremities:      Previously seen ---------------------------------------------------------------------- Targeted Anatomy (Fetus A)  Head/Neck  Mandible:              Seen on prior scan     Maxilla:                Seen on prior scan  Thorax  SVC:                   Seen on prior scan     3 V Trachea View:       Seen on prior scan  Interventr. Septum:    Seen on prior scan     IVC:                    Seen on prior scan  3 Vessel View:         Seen on prior scan     Crossing:               Appears normal  Abdomen  Ventral Wall:          Seen on prior scan  Extremities  Lt Hand:               Seen on prior scan     Lt Foot:                Seen on prior scan  Rt Hand:               Seen on prior scan     Rt Foot:                Seen on prior scan  Other  Genitalia:             Female ---------------------------------------------------------------------- Fetal Evaluation (Fetus B)  Num Of Fetuses:         2  Fetal Heart Rate(bpm):  134  Cardiac Activity:       Observed  Fetal Lie:              Upper LT  Presentation:           Breech  Placenta:               Anterior  P. Cord Insertion:      Marginal insertion prev  Membrane Desc:      Dividing Membrane seen - Monochorionic  Amniotic Fluid  AFI FV:      Within normal limits                              Largest Pocket(cm)                              4.9 ---------------------------------------------------------------------- Biometry (Fetus B)  LV:        7.8  mm ---------------------------------------------------------------------- Gestational Age (Fetus B)  LMP:           20w 3d        Date:   08/25/21                  EDD:   06/01/22  Best:          Hyacinth Meeker 3d     Det. By:  LMP  (08/25/21)          EDD:   06/01/22 ---------------------------------------------------------------------- Anatomy (Fetus B)  Cranium:               Appears normal         LVOT:                   Not well visualized  Cavum:                 Appears normal         Aortic Arch:            Appears normal  Ventricles:            Appears normal         Ductal Arch:            Not well visualized  Choroid Plexus:        Previously seen        Diaphragm:              Appears normal  Cerebellum:            Previously seen        Stomach:                Appears normal, left                                                                        sided  Posterior Fossa:       Previously seen        Abdomen:                Not well visualized  Nuchal Fold:           Previously seen        Abdominal Wall:         Previously seen  Face:                  Orbits appear          Cord Vessels:           Previously seen                         normal  Lips:                  Previously seen        Kidneys:                Appear normal  Palate:                Not well visualized    Bladder:                Appears normal  Thoracic:              Appears normal         Spine:                  Previously seen  Heart:                 Appears normal         Upper Extremities:      Previously seen                         (4CH, axis, and                         situs)  RVOT:                  Not well visualized    Lower Extremities:      Previously seen  Other:  Technically difficult due to fetal position. ---------------------------------------------------------------------- Targeted Anatomy (Fetus B)  Thorax  SVC:                   Seen on prior scan     3 V Trachea View:       Not well visualized  3 Vessel View:         Not well visualized    IVC:                    Seen on prior scan  Extremities  Lt Hand:               Seen on prior scan     Lt Foot:                 Seen on prior scan  Rt Hand:               Seen on prior scan     Rt Foot:                Seen on prior scan  Other  Genitalia:             Female ---------------------------------------------------------------------- Cervix Uterus Adnexa  Cervix  Cervical funneling ---------------------------------------------------------------------- Comments  Stacy Collier was seen due to a monochorionic, diamniotic  twin gestation with cervical funneling noted on her last  ultrasound exam 1 week ago.  She has been using daily  vaginal progesterone (Prometrium 400 mg daily) for the past  week.  She denies any lower abdominal cramping or  contractions.  On today's exam, there was normal amniotic fluid noted  around both twin A and twin B.  There were no signs of twin  to twin transfusion syndrome noted today.  A transvaginal ultrasound performed today shows significant  funneling of the cervix.  The gestational sac appears to funnel  all the way down to the internal cervical os.  The cervical stroma still appears to be  long and thick.  A digital exam performed today shows that her cervix is still  closed.  The increased risk of a preterm/previable birth due to the  funneled cervix was discussed.  Management options for a funneled cervix including  continued treatment with daily vaginal progesterone versus a  cerclage was discussed again today.  The patient has declined a cerclage placement and would like  to continue daily vaginal progesterone treatment.  Preterm labor precautions were reviewed.  Pelvic rest was  advised.  I will contact the patient's employer to have her work remotely  3 times a week.  Should she remain undelivered at 23 weeks (viability), we will  plan on administering a complete course of antenatal  corticosteroids.  We may also consider hospitalization on  modified bedrest starting at 23 weeks until she reaches a  more optimal gestational age (38 weeks).  She will return early next week for another  cervical length and  ultrasound exam to complete the views of the fetal anatomy.  Due to the monochorionic twin gestation, we will refer her to  pediatric cardiology for a fetal echocardiogram after her next  ultrasound exam next week.  The patient stated that all of her questions were answered  today.  A total of 20 minutes was spent counseling and coordinating  the care for this patient.  Greater than 50% of the time was  spent in direct face-to-face contact. ----------------------------------------------------------------------                   Johnell Comings, MD Electronically Signed Final Report   01/15/2022 05:48 pm ----------------------------------------------------------------------  [place] MFM OB DETAIL +14 WK  Result Date: 01/08/2022 ----------------------------------------------------------------------  OBSTETRICS REPORT                       (Signed Final 01/08/2022 05:46 pm) ---------------------------------------------------------------------- Patient Info  ID #:       [id number]                          D.O.B.:  08-Aug-1990 (31 yrs)  Name:       Stacy Collier               Visit Date: 01/07/2022 02:28 pm ---------------------------------------------------------------------- Performed By  Attending:        Johnell Comings MD         Secondary Phy.:   Everlene Farrier                                                             MD  Performed By:     Jacob Moores BS,       Address:          [address], [place]  [place]                                                             [address]  Referred By:      [place]          Location[facility]         [facility]                                [facility] at                                                             [facility]                                                              [facility]  Ref. Address:     [address]                    [place], [place] ---------------------------------------------------------------------- Orders  #  Description                           Code        Ordered By  1  [place] MFM OB DETAIL +14 WK               [id number]    JAMES TOMBLIN  2  [place] MFM OB DETAIL ADDL GEST            [id number]    JAMES TOMBLIN     +[address] [place]  3  [place] MFM OB TRANSVAGINAL                [id number]     Everlene Farrier ----------------------------------------------------------------------  #  Order #                     Accession #                Episode #  1  [id number]                   [id number]                 [id number]  2  [id number]                   [id number]                 [id number]  3  [id number]  [id number]                 [id number] ---------------------------------------------------------------------- Indications  Twin pregnancy, mono/di, second trimester      123456  Obesity complicating pregnancy, second         O99.212  trimester  Anemia during pregnancy in second trimester    O99.012  [redacted] weeks gestation of pregnancy                Z3A.19  History of sickle cell trait                   Z86.2  Encounter for antenatal screening for          Z36.3  malformations ---------------------------------------------------------------------- Fetal Evaluation (Fetus A)  Num Of Fetuses:         2  Fetal Heart Rate(bpm):  162  Cardiac Activity:       Observed  Fetal Lie:              Lower Right Fetus  Presentation:           Cephalic  Placenta:               Anterior  P. Cord Insertion:      Visualized  Membrane Desc:      Dividing Membrane seen - Monochorionic  Amniotic Fluid  AFI FV:      Subjectively upper-normal                              Largest Pocket(cm)                              6.3 ---------------------------------------------------------------------- Biometry (Fetus A)  BPD:      45.3  mm     G. Age:  19w 5d         68  %    CI:        79.04   %     70 - 86                                                          FL/HC:      17.4   %    16.1 - 18.3  HC:      161.1  mm     G. Age:  18w 6d         25  %    HC/AC:      1.13        1.09 - 1.39  AC:      142.3  mm     G. Age:  19w 4d         56  %    FL/BPD:     62.0   %  FL:       28.1  mm     G. Age:  18w 4d         20  %    FL/AC:      19.7   %    20 - 24  HUM:        29  mm     G. Age:  19w 3d  55  %  CER:      19.2  mm     G. Age:  18w 5d         26  %  NFT:       4.7  mm  LV:        9.5  mm  CM:        4.4  mm  Est. FW:     275  gm    0 lb 10 oz      36  %     FW Discordancy     0 \ 20 % ---------------------------------------------------------------------- OB History  Blood Type:   O+  Gravidity:    2         Term:   0        Prem:   0        SAB:   0  TOP:          1       Ectopic:  0        Living: 0 ---------------------------------------------------------------------- Gestational Age (Fetus A)  LMP:           19w 2d        Date:  08/25/21                  EDD:   06/01/22  U/S Today:     19w 1d                                        EDD:   06/02/22  Best:          19w 2d     Det. By:  LMP  (08/25/21)          EDD:   06/01/22 ---------------------------------------------------------------------- Anatomy (Fetus A)  Cranium:               Appears normal         LVOT:                   Appears normal  Cavum:                 Appears normal         Aortic Arch:            Not well visualized  Ventricles:            Appears normal         Ductal Arch:            Appears normal  Choroid Plexus:        Appears normal         Diaphragm:              Appears normal  Cerebellum:            Appears normal         Stomach:                Appears normal, left  sided  Posterior Fossa:       Appears normal         Abdomen:                Appears normal  Nuchal Fold:           Appears normal         Abdominal Wall:         Appears nml (cord                                                                         insert, abd wall)  Face:                  Appears normal         Cord Vessels:           Appears normal (3                         (orbits and profile)                           vessel cord)  Lips:                  Appears normal         Kidneys:                Appear normal  Palate:                Not well visualized    Bladder:                Appears normal  Thoracic:              Appears normal         Spine:                  Appears normal  Heart:                 Appears normal         Upper Extremities:      Appears normal                         (4CH, axis, and                         situs)  RVOT:                  Appears normal         Lower Extremities:      Appears normal ---------------------------------------------------------------------- Targeted Anatomy (Fetus A)  Head/Neck  Mandible:              Appears normal         Maxilla:                Appears normal  Thorax  SVC:                   Appears normal         3 V Trachea View:  Appears normal  Interventr. Septum:    Appears normal         IVC:                    Appears normal  3 Vessel View:         Appears normal         Crossing:               Appears normal  Extremities  Lt Hand:               Open hand nml          Lt Foot:                Nml heel/foot  Rt Hand:               Open hand nml          Rt Foot:                Nml heel/foot  Other  Genitalia:             Female ---------------------------------------------------------------------- Doppler - Fetal Vessels (Fetus A)  Umbilical Artery   S/D     %tile      RI    %tile      PI    %tile     PSV    ADFV    RDFV                                                     (cm/s)      3        7     0.7       19       1       4.4     27.9      No      No ---------------------------------------------------------------------- Fetal Evaluation (Fetus B)  Num Of Fetuses:         2  Fetal Heart Rate(bpm):  155  Cardiac Activity:        Observed  Fetal Lie:              Upper Left Fetus  Presentation:           Variable  Placenta:               Anterior  P. Cord Insertion:      Marginal insertion  Membrane Desc:      Dividing Membrane seen - Monochorionic  Amniotic Fluid  AFI FV:      Within normal limits                              Largest Pocket(cm)                              3.8 ---------------------------------------------------------------------- Biometry (Fetus B)  BPD:      41.5  mm     G. Age:  18w 4d         21  %    CI:        74.17   %    70 - 86  FL/HC:      16.7   %    16.1 - 18.3  HC:       153   mm     G. Age:  18w 2d          7  %    HC/AC:      1.21        1.09 - 1.39  AC:       126   mm     G. Age:  18w 1d         14  %    FL/BPD:     61.7   %  FL:       25.6  mm     G. Age:  17w 5d        4.7  %    FL/AC:      20.3   %    20 - 24  HUM:        27  mm     G. Age:  18w 4d         32  %  CER:      19.2  mm     G. Age:  18w 5d         26  %  NFT:       4.3  mm  LV:        7.6  mm  CM:        4.6  mm  Est. FW:     221  gm      0 lb 8 oz    3.3  %     FW Discordancy        20  % ---------------------------------------------------------------------- Gestational Age (Fetus B)  LMP:           19w 2d        Date:  08/25/21                  EDD:   06/01/22  U/S Today:     18w 1d                                        EDD:   06/09/22  Best:          19w 2d     Det. By:  LMP  (08/25/21)          EDD:   06/01/22 ---------------------------------------------------------------------- Anatomy (Fetus B)  Cranium:               Appears normal         LVOT:                   Not well visualized  Cavum:                 Appears normal         Aortic Arch:            Not well visualized  Ventricles:            Appears normal         Ductal Arch:            Not well visualized  Choroid Plexus:        Appears normal         Diaphragm:  Appears normal  Cerebellum:            Appears normal          Stomach:                Appears normal, left                                                                        sided  Posterior Fossa:       Appears normal         Abdomen:                Echogenic Bowel  Nuchal Fold:           Appears normal         Abdominal Wall:         Appears nml (cord                                                                        insert, abd wall)  Face:                  Appears normal         Cord Vessels:           Appears normal (3                         (orbits and profile)                           vessel cord)  Lips:                  Appears normal         Kidneys:                Appear normal  Palate:                Not well visualized    Bladder:                Appears normal  Thoracic:              Appears normal         Spine:                  Appears normal  Heart:                 Appears normal         Upper Extremities:      Appears normal                         (4CH, axis, and                         situs)  RVOT:  Not well visualized    Lower Extremities:      Appears normal  Other:  Technically difficult due to fetal position. ---------------------------------------------------------------------- Targeted Anatomy (Fetus B)  Thorax  SVC:                   Appears normal         3 V Trachea View:       Not well visualized  3 Vessel View:         Not well visualized    IVC:                    Appears normal  Extremities  Lt Hand:               Open hand nml          Lt Foot:                Nml heel/foot  Rt Hand:               Open hand nml          Rt Foot:                Nml heel/foot  Other  Genitalia:             Female ---------------------------------------------------------------------- Doppler - Fetal Vessels (Fetus B)  Umbilical Artery   S/D     %tile      RI    %tile                      PSV    ADFV    RDFV                                                     (cm/s)      7   > 97.5     0.9   > 97.5                        26      No       No ---------------------------------------------------------------------- Cervix Uterus Adnexa  Cervix  Length:              0  cm.  Appears dilated, see comments.  Uterus  No abnormality visualized.  Right Ovary  Size(cm)     3.26   x   3.19   x  2.69      Vol(ml): 14.65  Within normal limits.  Left Ovary  Size(cm)     2.93   x   3.06   x  2.59      Vol(ml): 12.16  Within normal limits.  Cul De Sac  No free fluid seen.  Adnexa  No abnormality visualized. ---------------------------------------------------------------------- Comments  Stacy Collier was seen due to a spontaneously conceived  twin pregnancy.  This is her first pregnancy.  She denies any significant past medical history and denies  any problems in her current pregnancy.  She denies prior  surgeries to her cervix.  She has not had any screening tests for fetal aneuploidy  drawn in her current pregnancy.  A thin dividing membrane was noted separating the two  fetuses along with a single placenta, indicating that these are  monochorionic, diamniotic twins.  The fetal growth and amniotic fluid level appeared  appropriate for her gestational age for twin A.  The overall EFW for twin B measured at the 3rd percentile for  her gestational age, indicating possible IUGR.  There was  only a 2 ounce difference noted between the EFW's of twin A  and twin B.  There were no obvious fetal anomalies seen in twin A.  Possible echogenic bowel was noted in twin B.  The limitations of ultrasound although since the detection of  anomalies was discussed.  A transvaginal ultrasound performed today shows cervical  funneling .  The cervical stroma still appears to be thick and  long.  A digital exam performed today shows that her cervix is  closed.  The following were discussed during today's consultation:  Monochorionic, diamniotic twin gestation  The implications and management of monochorionic twins  was discussed.  The 10% to 15% risk of twin to twin transfusion  syndrome  (TTTS) seen in monochorionic, diamniotic twins was  discussed today.  She was reassured that there were no  signs of TTTS noted today.  The implications and management of twin to TTTS should  she develop this complication was also discussed.  She was  advised that we will continue to follow her closely with serial  ultrasounds to assess for signs of TTTS.  She was advised that management of twin pregnancies will  involve frequent ultrasound exams to assess the fetal growth  and amniotic fluid level.  We will continue to follow her with ultrasound exams every 2  weeks to assess for signs of TTTS.  Weekly fetal testing should be started at around 32 weeks.  Delivery for uncomplicated monochorionic twins is  recommended at around 37 weeks.  The increased risk of preeclampsia, gestational diabetes, and  preterm birth/labor associated with twin pregnancies was  discussed.  As pregnancies with multiple gestations are at increased risk  for developing preeclampsia, she was advised to start taking  2 tablets of baby aspirin daily (81 mg each) to decrease her  risk of developing preeclampsia.  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  She was advised that as she does not have a history of a  prior preterm birth and as the appearance of the cervix still  appears to be thick and long, I believe that her risk of a  preterm birth is still low.  Management options for cervical funneling in a twin  pregnancy including continued expectant management, daily  vaginal progesterone, and a cervical cerclage were discussed.  The patient and her partner decided to try daily vaginal  progesterone for 1 week to see if the cervical funneling  disappears with the daily vaginal progesterone treatment.  As more recent studies have indicated that a higher dose of  vaginal progesterone may be beneficial in preventing preterm  birth in twin pregnancies, she  was advised to place 2 tablets  of Prometrium (200 mg each) in her vagina at bedtime daily.  She was given a note for work to work remotely at home.  A follow-up transvaginal cervical length measurement was  scheduled in our office in 1 week.  The couple will consider a cerclage placement next week  should further cervical funneling/shortening be noted at that  time.  Preterm labor precautions were reviewed.  She was advised  to go to the hospital should she complain of contractions or  lower  abdominal cramping.  Possible echogenic bowel noted in twin B  The causes of echogenic bowel including a normal variant,  fetal aneuploidy, swallowed  blood, cystic fibrosis, and viral  infections were discussed.  She denies any recent vaginal  bleeding.  Due to the echogenic bowel noted today, the patient was  offered and declined an amniocentesis for definitive  diagnosis of fetal aneuploidy.  The couple will consider having a cell free DNA test and  blood test for CMV and toxoplasmosis infections drawn when  she returns for her ultrasound next week.  The couple stated that they were provided with an  overwhelming amount of information today.  However, they  understood everything that was discussed with them today.  They stated that all of her questions had been answered.  A total of 60 minutes was spent counseling and coordinating  the care for this patient.  Greater than 50% of the time was  spent in direct face-to-face contact. ----------------------------------------------------------------------                   Johnell Comings, MD Electronically Signed Final Report   01/08/2022 05:46 pm ----------------------------------------------------------------------  [place] MFM OB DETAIL ADDL GEST +14 WK  Result Date: 01/08/2022 ----------------------------------------------------------------------  OBSTETRICS REPORT                       (Signed Final 01/08/2022 05:46 pm)  ---------------------------------------------------------------------- Patient Info  ID #:       [id number]                          D.O.B.:  15-Aug-1990 (31 yrs)  Name:       Stacy Hurdle Buczek               Visit Date: 01/07/2022 02:28 pm ---------------------------------------------------------------------- Performed By  Attending:        Johnell Comings MD         Secondary Phy.:   Everlene Farrier                                                             MD  Performed By:     Jacob Moores BS,       Address:          [address], [place]  [address]  Referred By:      [facility]          Location:         [facility]                                [facility]  Ref. Address:     [address]                    [place], [place]                    [address] ---------------------------------------------------------------------- Orders  #  Description                           Code        Ordered By  1  [place] MFM OB DETAIL +14 WK               [id number]    JAMES TOMBLIN  2  [place] MFM OB DETAIL ADDL GEST            76811.02    JAMES TOMBLIN     +14 WK  3  [place] MFM OB TRANSVAGINAL                48889.1     Harold Hedge ----------------------------------------------------------------------  #  Order #                     Accession #                Episode #  1  [id number]                   [id number]                 [id number]  2  [id number]                   [id number]                 [id number]  3  [id number]                   [id number]                 [id number] ---------------------------------------------------------------------- Indications  Twin  pregnancy, mono/di, second trimester      O30.032  Obesity complicating pregnancy, second         O99.212  trimester  Anemia during pregnancy in second trimester    O99.012  [redacted] weeks gestation of pregnancy                Z3A.19  History of sickle cell trait                   Z86.2  Encounter for antenatal screening for          Z36.3  malformations ---------------------------------------------------------------------- Fetal Evaluation (Fetus A)  Num Of Fetuses:         2  Fetal Heart Rate(bpm):  162  Cardiac Activity:       Observed  Fetal Lie:              Lower Right Fetus  Presentation:           Cephalic  Placenta:               Anterior  P. Cord Insertion:      Visualized  Membrane Desc:      Dividing Membrane seen - Monochorionic  Amniotic Fluid  AFI FV:      Subjectively upper-normal                              Largest Pocket(cm)                              6.3 ---------------------------------------------------------------------- Biometry (Fetus A)  BPD:      45.3  mm     G. Age:  19w 5d         68  %    CI:        79.04   %    70 - 86                                                          FL/HC:      17.4   %    16.1 - 18.3  HC:      161.1  mm     G. Age:  18w 6d         25  %    HC/AC:      1.13        1.09 - 1.39  AC:      142.3  mm     G. Age:  19w 4d         56  %    FL/BPD:     62.0   %  FL:       28.1  mm     G. Age:  18w 4d         20  %    FL/AC:      19.7   %    20 - 24  HUM:        29  mm     G. Age:  19w 3d         55  %  CER:      19.2  mm     G. Age:  18w 5d         26  %  NFT:       4.7  mm  LV:        9.5  mm  CM:        4.4  mm  Est. FW:     275  gm    0 lb 10 oz      36  %     FW Discordancy     0 \ 20 % ---------------------------------------------------------------------- OB History  Blood Type:   O+  Gravidity:    2         Term:   0        Prem:   0        SAB:   0  TOP:          1       Ectopic:  0        Living: 0  ---------------------------------------------------------------------- Gestational Age (Fetus A)  LMP:           19w 2d        Date:  08/25/21                  EDD:   06/01/22  U/S Today:     19w 1d                                        EDD:   06/02/22  Best:          19w 2d     Det. By:  LMP  (08/25/21)          EDD:   06/01/22 ---------------------------------------------------------------------- Anatomy (Fetus A)  Cranium:               Appears normal         LVOT:                   Appears normal  Cavum:                 Appears normal         Aortic Arch:            Not well visualized  Ventricles:            Appears normal         Ductal Arch:            Appears normal  Choroid Plexus:        Appears normal         Diaphragm:              Appears normal  Cerebellum:            Appears normal         Stomach:                Appears normal, left                                                                        sided  Posterior Fossa:       Appears normal         Abdomen:                Appears normal  Nuchal Fold:  Appears normal         Abdominal Wall:         Appears nml (cord                                                                        insert, abd wall)  Face:                  Appears normal         Cord Vessels:           Appears normal (3                         (orbits and profile)                           vessel cord)  Lips:                  Appears normal         Kidneys:                Appear normal  Palate:                Not well visualized    Bladder:                Appears normal  Thoracic:              Appears normal         Spine:                  Appears normal  Heart:                 Appears normal         Upper Extremities:      Appears normal                         (4CH, axis, and                         situs)  RVOT:                  Appears normal         Lower Extremities:      Appears normal ----------------------------------------------------------------------  Targeted Anatomy (Fetus A)  Head/Neck  Mandible:              Appears normal         Maxilla:                Appears normal  Thorax  SVC:                   Appears normal         3 V Trachea View:       Appears normal  Interventr. Septum:    Appears normal         IVC:                    Appears normal  3 Vessel View:  Appears normal         Crossing:               Appears normal  Extremities  Lt Hand:               Open hand nml          Lt Foot:                Nml heel/foot  Rt Hand:               Open hand nml          Rt Foot:                Nml heel/foot  Other  Genitalia:             Female ---------------------------------------------------------------------- Doppler - Fetal Vessels (Fetus A)  Umbilical Artery   S/D     %tile      RI    %tile      PI    %tile     PSV    ADFV    RDFV                                                     (cm/s)      3        7     0.7       19       1       4.4     27.9      No      No ---------------------------------------------------------------------- Fetal Evaluation (Fetus B)  Num Of Fetuses:         2  Fetal Heart Rate(bpm):  155  Cardiac Activity:       Observed  Fetal Lie:              Upper Left Fetus  Presentation:           Variable  Placenta:               Anterior  P. Cord Insertion:      Marginal insertion  Membrane Desc:      Dividing Membrane seen - Monochorionic  Amniotic Fluid  AFI FV:      Within normal limits                              Largest Pocket(cm)                              3.8 ---------------------------------------------------------------------- Biometry (Fetus B)  BPD:      41.5  mm     G. Age:  18w 4d         21  %    CI:        74.17   %    70 - 86                                                          FL/HC:  16.7   %    16.1 - 18.3  HC:       153   mm     G. Age:  18w 2d          7  %    HC/AC:      1.21        1.09 - 1.39  AC:       126   mm     G. Age:  18w 1d         14  %    FL/BPD:     61.7   %  FL:       25.6  mm     G.  Age:  17w 5d        4.7  %    FL/AC:      20.3   %    20 - 24  HUM:        27  mm     G. Age:  18w 4d         32  %  CER:      19.2  mm     G. Age:  18w 5d         26  %  NFT:       4.3  mm  LV:        7.6  mm  CM:        4.6  mm  Est. FW:     221  gm      0 lb 8 oz    3.3  %     FW Discordancy        20  % ---------------------------------------------------------------------- Gestational Age (Fetus B)  LMP:           19w 2d        Date:  08/25/21                  EDD:   06/01/22  U/S Today:     18w 1d                                        EDD:   06/09/22  Best:          19w 2d     Det. By:  LMP  (08/25/21)          EDD:   06/01/22 ---------------------------------------------------------------------- Anatomy (Fetus B)  Cranium:               Appears normal         LVOT:                   Not well visualized  Cavum:                 Appears normal         Aortic Arch:            Not well visualized  Ventricles:            Appears normal         Ductal Arch:            Not well visualized  Choroid Plexus:        Appears normal         Diaphragm:  Appears normal  Cerebellum:            Appears normal         Stomach:                Appears normal, left                                                                        sided  Posterior Fossa:       Appears normal         Abdomen:                Echogenic Bowel  Nuchal Fold:           Appears normal         Abdominal Wall:         Appears nml (cord                                                                        insert, abd wall)  Face:                  Appears normal         Cord Vessels:           Appears normal (3                         (orbits and profile)                           vessel cord)  Lips:                  Appears normal         Kidneys:                Appear normal  Palate:                Not well visualized    Bladder:                Appears normal  Thoracic:              Appears normal         Spine:                  Appears normal   Heart:                 Appears normal         Upper Extremities:      Appears normal                         (4CH, axis, and                         situs)  RVOT:  Not well visualized    Lower Extremities:      Appears normal  Other:  Technically difficult due to fetal position. ---------------------------------------------------------------------- Targeted Anatomy (Fetus B)  Thorax  SVC:                   Appears normal         3 V Trachea View:       Not well visualized  3 Vessel View:         Not well visualized    IVC:                    Appears normal  Extremities  Lt Hand:               Open hand nml          Lt Foot:                Nml heel/foot  Rt Hand:               Open hand nml          Rt Foot:                Nml heel/foot  Other  Genitalia:             Female ---------------------------------------------------------------------- Doppler - Fetal Vessels (Fetus B)  Umbilical Artery   S/D     %tile      RI    %tile                      PSV    ADFV    RDFV                                                     (cm/s)      7   > 97.5     0.9   > 97.5                        26      No      No ---------------------------------------------------------------------- Cervix Uterus Adnexa  Cervix  Length:              0  cm.  Appears dilated, see comments.  Uterus  No abnormality visualized.  Right Ovary  Size(cm)     3.26   x   3.19   x  2.69      Vol(ml): 14.65  Within normal limits.  Left Ovary  Size(cm)     2.93   x   3.06   x  2.59      Vol(ml): 12.16  Within normal limits.  Cul De Sac  No free fluid seen.  Adnexa  No abnormality visualized. ---------------------------------------------------------------------- Comments  Stacy Collier was seen due to a spontaneously conceived  twin pregnancy.  This is her first pregnancy.  She denies any significant past medical history and denies  any problems in her current pregnancy.  She denies prior  surgeries to her cervix.  She has not had any screening  tests for fetal aneuploidy  drawn in her current pregnancy.  A thin dividing membrane was noted separating the two  fetuses along with a single placenta, indicating that these are  monochorionic, diamniotic twins.  The fetal growth and amniotic fluid level appeared  appropriate for her gestational age for twin A.  The overall EFW for twin B measured at the 3rd percentile for  her gestational age, indicating possible IUGR.  There was  only a 2 ounce difference noted between the EFW's of twin A  and twin B.  There were no obvious fetal anomalies seen in twin A.  Possible echogenic bowel was noted in twin B.  The limitations of ultrasound although since the detection of  anomalies was discussed.  A transvaginal ultrasound performed today shows cervical  funneling .  The cervical stroma still appears to be thick and  long.  A digital exam performed today shows that her cervix is  closed.  The following were discussed during today's consultation:  Monochorionic, diamniotic twin gestation  The implications and management of monochorionic twins  was discussed.  The 10% to 15% risk of twin to twin transfusion syndrome  (TTTS) seen in monochorionic, diamniotic twins was  discussed today.  She was reassured that there were no  signs of TTTS noted today.  The implications and management of twin to TTTS should  she develop this complication was also discussed.  She was  advised that we will continue to follow her closely with serial  ultrasounds to assess for signs of TTTS.  She was advised that management of twin pregnancies will  involve frequent ultrasound exams to assess the fetal growth  and amniotic fluid level.  We will continue to follow her with ultrasound exams every 2  weeks to assess for signs of TTTS.  Weekly fetal testing should be started at around 32 weeks.  Delivery for uncomplicated monochorionic twins is  recommended at around 37 weeks.  The increased risk of preeclampsia, gestational diabetes, and  preterm  birth/labor associated with twin pregnancies was  discussed.  As pregnancies with multiple gestations are at increased risk  for developing preeclampsia, she was advised to start taking  2 tablets of baby aspirin daily (81 mg each) to decrease her  risk of developing preeclampsia.  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  She was advised that as she does not have a history of a  prior preterm birth and as the appearance of the cervix still  appears to be thick and long, I believe that her risk of a  preterm birth is still low.  Management options for cervical funneling in a twin  pregnancy including continued expectant management, daily  vaginal progesterone, and a cervical cerclage were discussed.  The patient and her partner decided to try daily vaginal  progesterone for 1 week to see if the cervical funneling  disappears with the daily vaginal progesterone treatment.  As more recent studies have indicated that a higher dose of  vaginal progesterone may be beneficial in preventing preterm  birth in twin pregnancies, she was advised to place 2 tablets  of Prometrium (200 mg each) in her vagina at bedtime daily.  She was given a note for work to work remotely at home.  A follow-up transvaginal cervical length measurement was  scheduled in our office in 1 week.  The couple will consider a cerclage placement next week  should further cervical funneling/shortening be noted at that  time.  Preterm labor precautions were reviewed.  She was advised  to go to the hospital should she complain of contractions or  lower abdominal  cramping.  Possible echogenic bowel noted in twin B  The causes of echogenic bowel including a normal variant,  fetal aneuploidy, swallowed  blood, cystic fibrosis, and viral  infections were discussed.  She denies any recent vaginal  bleeding.  Due to the echogenic bowel noted today, the patient was  offered and  declined an amniocentesis for definitive  diagnosis of fetal aneuploidy.  The couple will consider having a cell free DNA test and  blood test for CMV and toxoplasmosis infections drawn when  she returns for her ultrasound next week.  The couple stated that they were provided with an  overwhelming amount of information today.  However, they  understood everything that was discussed with them today.  They stated that all of her questions had been answered.  A total of 60 minutes was spent counseling and coordinating  the care for this patient.  Greater than 50% of the time was  spent in direct face-to-face contact. ----------------------------------------------------------------------                   Ma Rings, MD Electronically Signed Final Report   01/08/2022 05:46 pm ----------------------------------------------------------------------  [place] [facility] Transvaginal  Result Date: 01/08/2022 ----------------------------------------------------------------------  OBSTETRICS REPORT                       (Signed Final 01/08/2022 05:46 pm) ---------------------------------------------------------------------- Patient Info  ID #:       [id number]                          D.O.B.:  31-Oct-1990 (31 yrs)  Name:       Stacy Collier               Visit Date: 01/07/2022 02:28 pm ---------------------------------------------------------------------- Performed By  Attending:        Ma Rings MD         Secondary Phy.:   Harold Hedge                                                             MD  Performed By:     Tommie Raymond BS,       Address:          [address], [address]                                                             Road                                                             [place]  [id number]  Referred By:      [place]          Location:         [facility]                                 [facility]                                                             [facility]                                                             [facility]  Ref. Address:     [address]                    [place], [place] ---------------------------------------------------------------------- Orders  #  Description                           Code        Ordered By  1  [place] [facility] OB DETAIL +14 WK               [id number]    JAMES TOMBLIN  2  [place] [facility] OB DETAIL ADDL GEST            76811.02    JAMES TOMBLIN     +14 Howard  3  [place] [facility] OB TRANSVAGINAL                [id number]     Everlene Farrier ----------------------------------------------------------------------  #  Order #                     Accession #                Episode #  1  [id number]                   [id number]                 [id number]  2  [id number]                   [id number]                 [id number]  3  [id number]                   [id number]                 [id number] ---------------------------------------------------------------------- Indications  Twin pregnancy, mono/di, second trimester      123456  Obesity complicating pregnancy, second         O99.212  trimester  Anemia during pregnancy in second trimester    O99.012  [redacted] weeks gestation of pregnancy                Z3A.19  History of sickle cell trait                   Z86.2  Encounter for antenatal screening for          Z36.3  malformations ---------------------------------------------------------------------- Fetal Evaluation (Fetus A)  Num Of Fetuses:         2  Fetal Heart Rate(bpm):  162  Cardiac Activity:       Observed  Fetal Lie:              Lower Right Fetus  Presentation:           Cephalic  Placenta:               Anterior  P. Cord Insertion:      Visualized  Membrane Desc:      Dividing Membrane seen - Monochorionic  Amniotic Fluid  AFI FV:      Subjectively upper-normal                              Largest Pocket(cm)                              6.3  ---------------------------------------------------------------------- Biometry (Fetus A)  BPD:      45.3  mm     G. Age:  19w 5d         68  %    CI:        79.04   %    70 - 86                                                          FL/HC:      17.4   %    16.1 - 18.3  HC:      161.1  mm     G. Age:  18w 6d         25  %    HC/AC:      1.13        1.09 - 1.39  AC:      142.3  mm     G. Age:  19w 4d         56  %    FL/BPD:     62.0   %  FL:       28.1  mm     G. Age:  18w 4d         20  %    FL/AC:      19.7   %    20 - 24  HUM:        29  mm     G. Age:  19w 3d         55  %  CER:      19.2  mm     G. Age:  18w 5d         26  %  NFT:       4.7  mm  LV:        9.5  mm  CM:        4.4  mm  Est. FW:     275  gm    0 lb 10 oz      36  %     FW Discordancy     0 \ 20 % ---------------------------------------------------------------------- OB History  Blood Type:   O+  Gravidity:    2         Term:   0        Prem:   0        SAB:   0  TOP:          1       Ectopic:  0        Living: 0 ---------------------------------------------------------------------- Gestational Age (Fetus A)  LMP:           19w 2d        Date:  08/25/21                  EDD:   06/01/22  U/S Today:     19w 1d                                        EDD:   06/02/22  Best:          19w 2d     Det. By:  LMP  (08/25/21)          EDD:   06/01/22 ---------------------------------------------------------------------- Anatomy (Fetus A)  Cranium:               Appears normal         LVOT:                   Appears normal  Cavum:                 Appears normal         Aortic Arch:            Not well visualized  Ventricles:            Appears normal         Ductal Arch:            Appears normal  Choroid Plexus:        Appears normal         Diaphragm:              Appears normal  Cerebellum:            Appears normal         Stomach:                Appears normal, left                                                                        sided  Posterior  Fossa:       Appears normal         Abdomen:                Appears normal  Nuchal Fold:  Appears normal         Abdominal Wall:         Appears nml (cord                                                                        insert, abd wall)  Face:                  Appears normal         Cord Vessels:           Appears normal (3                         (orbits and profile)                           vessel cord)  Lips:                  Appears normal         Kidneys:                Appear normal  Palate:                Not well visualized    Bladder:                Appears normal  Thoracic:              Appears normal         Spine:                  Appears normal  Heart:                 Appears normal         Upper Extremities:      Appears normal                         (4CH, axis, and                         situs)  RVOT:                  Appears normal         Lower Extremities:      Appears normal ---------------------------------------------------------------------- Targeted Anatomy (Fetus A)  Head/Neck  Mandible:              Appears normal         Maxilla:                Appears normal  Thorax  SVC:                   Appears normal         3 V Trachea View:       Appears normal  Interventr. Septum:    Appears normal         IVC:                    Appears normal  3 Vessel View:  Appears normal         Crossing:               Appears normal  Extremities  Lt Hand:               Open hand nml          Lt Foot:                Nml heel/foot  Rt Hand:               Open hand nml          Rt Foot:                Nml heel/foot  Other  Genitalia:             Female ---------------------------------------------------------------------- Doppler - Fetal Vessels (Fetus A)  Umbilical Artery   S/D     %tile      RI    %tile      PI    %tile     PSV    ADFV    RDFV                                                     (cm/s)      3        7     0.7       19       1       4.4     27.9      No      No  ---------------------------------------------------------------------- Fetal Evaluation (Fetus B)  Num Of Fetuses:         2  Fetal Heart Rate(bpm):  155  Cardiac Activity:       Observed  Fetal Lie:              Upper Left Fetus  Presentation:           Variable  Placenta:               Anterior  P. Cord Insertion:      Marginal insertion  Membrane Desc:      Dividing Membrane seen - Monochorionic  Amniotic Fluid  AFI FV:      Within normal limits                              Largest Pocket(cm)                              3.8 ---------------------------------------------------------------------- Biometry (Fetus B)  BPD:      41.5  mm     G. Age:  18w 4d         21  %    CI:        74.17   %    70 - 86                                                          FL/HC:  16.7   %    16.1 - 18.3  HC:       153   mm     G. Age:  18w 2d          7  %    HC/AC:      1.21        1.09 - 1.39  AC:       126   mm     G. Age:  18w 1d         14  %    FL/BPD:     61.7   %  FL:       25.6  mm     G. Age:  17w 5d        4.7  %    FL/AC:      20.3   %    20 - 24  HUM:        27  mm     G. Age:  18w 4d         32  %  CER:      19.2  mm     G. Age:  18w 5d         26  %  NFT:       4.3  mm  LV:        7.6  mm  CM:        4.6  mm  Est. FW:     221  gm      0 lb 8 oz    3.3  %     FW Discordancy        20  % ---------------------------------------------------------------------- Gestational Age (Fetus B)  LMP:           19w 2d        Date:  08/25/21                  EDD:   06/01/22  U/S Today:     18w 1d                                        EDD:   06/09/22  Best:          19w 2d     Det. By:  LMP  (08/25/21)          EDD:   06/01/22 ---------------------------------------------------------------------- Anatomy (Fetus B)  Cranium:               Appears normal         LVOT:                   Not well visualized  Cavum:                 Appears normal         Aortic Arch:            Not well visualized  Ventricles:            Appears normal          Ductal Arch:            Not well visualized  Choroid Plexus:        Appears normal         Diaphragm:  Appears normal  Cerebellum:            Appears normal         Stomach:                Appears normal, left                                                                        sided  Posterior Fossa:       Appears normal         Abdomen:                Echogenic Bowel  Nuchal Fold:           Appears normal         Abdominal Wall:         Appears nml (cord                                                                        insert, abd wall)  Face:                  Appears normal         Cord Vessels:           Appears normal (3                         (orbits and profile)                           vessel cord)  Lips:                  Appears normal         Kidneys:                Appear normal  Palate:                Not well visualized    Bladder:                Appears normal  Thoracic:              Appears normal         Spine:                  Appears normal  Heart:                 Appears normal         Upper Extremities:      Appears normal                         (4CH, axis, and                         situs)  RVOT:  Not well visualized    Lower Extremities:      Appears normal  Other:  Technically difficult due to fetal position. ---------------------------------------------------------------------- Targeted Anatomy (Fetus B)  Thorax  SVC:                   Appears normal         3 V Trachea View:       Not well visualized  3 Vessel View:         Not well visualized    IVC:                    Appears normal  Extremities  Lt Hand:               Open hand nml          Lt Foot:                Nml heel/foot  Rt Hand:               Open hand nml          Rt Foot:                Nml heel/foot  Other  Genitalia:             Female ---------------------------------------------------------------------- Doppler - Fetal Vessels (Fetus B)  Umbilical Artery   S/D     %tile      RI     %tile                      PSV    ADFV    RDFV                                                     (cm/s)      7   > 97.5     0.9   > 97.5                        26      No      No ---------------------------------------------------------------------- Cervix Uterus Adnexa  Cervix  Length:              0  cm.  Appears dilated, see comments.  Uterus  No abnormality visualized.  Right Ovary  Size(cm)     3.26   x   3.19   x  2.69      Vol(ml): 14.65  Within normal limits.  Left Ovary  Size(cm)     2.93   x   3.06   x  2.59      Vol(ml): 12.16  Within normal limits.  Cul De Sac  No free fluid seen.  Adnexa  No abnormality visualized. ---------------------------------------------------------------------- Comments  Stacy Collier was seen due to a spontaneously conceived  twin pregnancy.  This is her first pregnancy.  She denies any significant past medical history and denies  any problems in her current pregnancy.  She denies prior  surgeries to her cervix.  She has not had any screening tests for fetal aneuploidy  drawn in her current pregnancy.  A thin dividing membrane was noted separating the two  fetuses along with a single placenta, indicating that these are  monochorionic, diamniotic twins.  The fetal growth and amniotic fluid level appeared  appropriate for her gestational age for twin A.  The overall EFW for twin B measured at the 3rd percentile for  her gestational age, indicating possible IUGR.  There was  only a 2 ounce difference noted between the EFW's of twin A  and twin B.  There were no obvious fetal anomalies seen in twin A.  Possible echogenic bowel was noted in twin B.  The limitations of ultrasound although since the detection of  anomalies was discussed.  A transvaginal ultrasound performed today shows cervical  funneling .  The cervical stroma still appears to be thick and  long.  A digital exam performed today shows that her cervix is  closed.  The following were discussed during today's  consultation:  Monochorionic, diamniotic twin gestation  The implications and management of monochorionic twins  was discussed.  The 10% to 15% risk of twin to twin transfusion syndrome  (TTTS) seen in monochorionic, diamniotic twins was  discussed today.  She was reassured that there were no  signs of TTTS noted today.  The implications and management of twin to TTTS should  she develop this complication was also discussed.  She was  advised that we will continue to follow her closely with serial  ultrasounds to assess for signs of TTTS.  She was advised that management of twin pregnancies will  involve frequent ultrasound exams to assess the fetal growth  and amniotic fluid level.  We will continue to follow her with ultrasound exams every 2  weeks to assess for signs of TTTS.  Weekly fetal testing should be started at around 32 weeks.  Delivery for uncomplicated monochorionic twins is  recommended at around 37 weeks.  The increased risk of preeclampsia, gestational diabetes, and  preterm birth/labor associated with twin pregnancies was  discussed.  As pregnancies with multiple gestations are at increased risk  for developing preeclampsia, she was advised to start taking  2 tablets of baby aspirin daily (81 mg each) to decrease her  risk of developing preeclampsia.  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  She was advised that as she does not have a history of a  prior preterm birth and as the appearance of the cervix still  appears to be thick and long, I believe that her risk of a  preterm birth is still low.  Management options for cervical funneling in a twin  pregnancy including continued expectant management, daily  vaginal progesterone, and a cervical cerclage were discussed.  The patient and her partner decided to try daily vaginal  progesterone for 1 week to see if the cervical funneling  disappears with the daily vaginal  progesterone treatment.  As more recent studies have indicated that a higher dose of  vaginal progesterone may be beneficial in preventing preterm  birth in twin pregnancies, she was advised to place 2 tablets  of Prometrium (200 mg each) in her vagina at bedtime daily.  She was given a note for work to work remotely at home.  A follow-up transvaginal cervical length measurement was  scheduled in our office in 1 week.  The couple will consider a cerclage placement next week  should further cervical funneling/shortening be noted at that  time.  Preterm labor precautions were reviewed.  She was advised  to go to the hospital should she complain of contractions or  lower  abdominal cramping.  Possible echogenic bowel noted in twin B  The causes of echogenic bowel including a normal variant,  fetal aneuploidy, swallowed  blood, cystic fibrosis, and viral  infections were discussed.  She denies any recent vaginal  bleeding.  Due to the echogenic bowel noted today, the patient was  offered and declined an amniocentesis for definitive  diagnosis of fetal aneuploidy.  The couple will consider having a cell free DNA test and  blood test for CMV and toxoplasmosis infections drawn when  she returns for her ultrasound next week.  The couple stated that they were provided with an  overwhelming amount of information today.  However, they  understood everything that was discussed with them today.  They stated that all of her questions had been answered.  A total of 60 minutes was spent counseling and coordinating  the care for this patient.  Greater than 50% of the time was  spent in direct face-to-face contact. ----------------------------------------------------------------------                   Johnell Comings, MD Electronically Signed Final Report   01/08/2022 05:46 pm ----------------------------------------------------------------------   MAU Course/MDM: I have reviewed the triage vital signs and the nursing notes.    Pertinent labs & imaging results that were available during my care of the patient were reviewed by me and considered in my medical decision making (see chart for details).      I have reviewed her medical records including past results, notes and treatments.   I have ordered labs and reviewed results.  NST reviewed Consult *** with presentation, exam findings and test results.  Treatments in MAU included ***.    Assessment: No diagnosis found.  Plan: Discharge home Labor precautions and fetal kick counts Follow up in Office for prenatal visits and recheck   Pt stable at time of discharge.  Hansel Feinstein CNM, MSN Certified Nurse-Midwife 01/20/2022 12:00 AM

## 2022-01-20 NOTE — H&P (Signed)
Date of Service: 01/20/2022 12:00 AM   Cosign Needed          HPI: Stacy Collier is a 31 y.o. G2P0010 at [redacted]w[redacted]d with mono/di twins who presents to maternity admissions reporting small amount of bleeding at home.  Was noted to have a shortened cervix on 01/07/22 and declined cerclage,  has been using progesterone. She denies LOF, vaginal itching/burning, urinary symptoms, h/a, dizziness, n/v, diarrhea, constipation or fever/chills.     Vaginal Bleeding The patient's primary symptoms include vaginal bleeding. The patient's pertinent negatives include no genital itching, genital odor or pelvic pain. This is a new problem. The current episode started today. The patient is experiencing no pain. She is pregnant. Pertinent negatives include no abdominal pain, back pain, chills, dysuria or fever. The vaginal discharge was normal. The vaginal bleeding is spotting. She has not been passing clots. She has not been passing tissue. Nothing aggravates the symptoms. She has tried nothing for the symptoms.    Past Medical History:     Past Medical History:  Diagnosis Date   Anxiety     Depression        Past obstetric history:                 OB History  Gravida Para Term Preterm AB Living  2       1    SAB IAB Ectopic Multiple Live Births       1              # Outcome Date GA Lbr Len/2nd Weight Sex Delivery Anes PTL Lv  2 Current                    1 IAB 2019 [redacted]w[redacted]d                    Past Surgical History:      Past Surgical History:  Procedure Laterality Date   WISDOM TOOTH EXTRACTION          Family History:      Family History  Problem Relation Age of Onset   Anxiety disorder Mother     Schizophrenia Maternal Aunt     Cancer Paternal Grandmother        Social History: Social History         Tobacco Use   Smoking status: Never   Smokeless tobacco: Never  Substance Use Topics   Alcohol use: Not Currently      Alcohol/week: 0.0 standard drinks of alcohol   Drug use:  Never      Allergies: No Known Allergies   Meds:         Medications Prior to Admission  Medication Sig Dispense Refill Last Dose   Prenatal Vit-Fe Fumarate-FA (PRENATAL PO) Take by mouth.     01/18/2022   progesterone (PROMETRIUM) 200 MG capsule Place two capsules (400 mg total) in vagina daily 60 capsule 0 Past Week   Acetaminophen (TYLENOL 8 HOUR PO) Take by mouth.         ELDERBERRY PO Take by mouth. (Patient not taking: Reported on 01/04/2022)         Ferrous Sulfate (IRON PO) Take by mouth. (Patient not taking: Reported on 01/04/2022)         fluticasone (FLONASE) 50 MCG/ACT nasal spray Place 1-2 sprays into both nostrils daily. 11.1 mL 2     Magnesium Oxide -Mg Supplement (MAG-OXIDE) 200 MG TABS Take 2 tablets (400 mg  total) by mouth at bedtime. If that amount causes loose stools in the am, switch to 200mg  daily at bedtime. 60 tablet 3     Ondansetron HCl (ZOFRAN PO) Take by mouth. (Patient not taking: Reported on 01/04/2022)         Pyridoxine HCl (B-6 PO) Take by mouth.         zinc gluconate 50 MG tablet Take 50 mg by mouth daily. (Patient not taking: Reported on 12/06/2021)            I have reviewed patient's Past Medical Hx, Surgical Hx, Family Hx, Social Hx, medications and allergies.    ROS:  Review of Systems  Constitutional:  Negative for chills and fever.  Gastrointestinal:  Negative for abdominal pain.  Genitourinary:  Positive for vaginal bleeding. Negative for dysuria and pelvic pain.  Musculoskeletal:  Negative for back pain.    Other systems negative   Physical Exam  Patient Vitals for the past 24 hrs:   BP Temp Pulse Resp SpO2 Height Weight  01/19/22 2329 100/74 -- -- -- -- -- --  01/19/22 2326 -- 98.5 F (36.9 C) (!) 106 17 98 % 5\' 3"  (1.6 m) 85.3 kg    Examination completed by 01/21/22  Constitutional: Well-developed, well-nourished female in no acute distress.  Cardiovascular: normal rate and rhythm Respiratory: normal effort GI: Abd soft,  non-tender, gravid appropriate for gestational age.   No rebound or guarding. MS: Extremities nontender, no edema, normal ROM Neurologic: Alert and oriented x 4.  GU: Neg CVAT.   PELVIC EXAM:  Speculum inserted gently, visualized cervical dilation to aobut 5cm, bulging membranes.  Small amount of pooling,  +ferning FHT:   160/165   Labs: Lab Results Last 24 Hours  No results found for this or any previous visit (from the past 24 hour(s)).   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  Patient given options of expectant management or proceeding with delivery (IOL vs D&E); she is leaning towards expectant management.  Discussed risks of increased risks of infection (chorioamnionitis, endometritis), abruptio placentae, cord prolapse, fetal death, preterm birth, and need for cesarean delivery. Also emphasized that the neonate is at risk for morbidity/mortality related to preterm birth, infection, pulmonary hypoplasia, and musculoskeletal deformation.  She was told that if she makes it to 22.5 weeks, she will be admitted for inpatient observation, steroids and antibiotics; none of that is indicated for now.  She was told to call for any signs/symptoms of infection or bleeding; chorioamnionitis or abruption are indications for delivery.

## 2022-01-20 NOTE — Progress Notes (Signed)
FACULTY PRACTICE ANTEPARTUM COMPREHENSIVE PROGRESS NOTE  Stacy Collier is a 31 y.o. G2P0010 at [redacted]w[redacted]d who is admitted for previable PPROM in the setting of MoDi twins.  Estimated Date of Delivery: 06/01/22 Fetal presentation is unsure.  Length of Stay:  0 Days. Admitted 01/19/2022  Subjective: Notes new onset bleeding. Prior to this, no bleeding.   Patient reports good fetal movement.  She reports no uterine contractions.  Vitals:  Blood pressure (!) 100/52, pulse 81, temperature 98.4 F (36.9 C), temperature source Oral, resp. rate 16, height 5\' 3"  (1.6 m), weight 85.3 kg, last menstrual period 08/25/2021, SpO2 98 %. Physical Examination: CONSTITUTIONAL: Well-developed, well-nourished female in no acute distress.  NEUROLOGIC: Alert and oriented to person, place, and time. No cranial nerve deficit noted. PSYCHIATRIC: Normal mood and affect. Normal behavior. Normal judgment and thought content. CARDIOVASCULAR: Normal heart rate noted, regular rhythm RESPIRATORY: Effort and breath sounds normal, no problems with respiration noted MUSCULOSKELETAL: Normal range of motion. No edema and no tenderness. 2+ distal pulses. ABDOMEN: Soft, nontender, nondistended, gravid. CERVIX: Dilation: 5 Exam by:: [email or web address] CNM    Results for orders placed or performed during the hospital encounter of 01/19/22 (from the past 48 hour(s))  Urinalysis, Routine w reflex microscopic Urine, Clean Catch     Status: Abnormal   Collection Time: 01/19/22 11:45 PM  Result Value Ref Range   Color, Urine YELLOW YELLOW   APPearance HAZY (A) CLEAR   Specific Gravity, Urine 1.013 1.005 - 1.030   pH 5.0 5.0 - 8.0   Glucose, UA NEGATIVE NEGATIVE mg/dL   Hgb urine dipstick LARGE (A) NEGATIVE   Bilirubin Urine NEGATIVE NEGATIVE   Ketones, ur 80 (A) NEGATIVE mg/dL   Protein, ur 30 (A) NEGATIVE mg/dL   Nitrite NEGATIVE NEGATIVE   Leukocytes,Ua MODERATE (A) NEGATIVE   RBC / HPF 11-20 0 - 5 RBC/hpf   WBC, UA 21-50  0 - 5 WBC/hpf   Bacteria, UA NONE SEEN NONE SEEN   Squamous Epithelial / LPF 11-20 0 - 5   Mucus PRESENT     Comment: Performed at [facility], [address], [place], [place]  Type and screen [facility]     Status: None   Collection Time: 01/20/22  1:53 AM  Result Value Ref Range   ABO/RH(D) O POS    Antibody Screen NEG    Sample Expiration      01/23/2022,2359 Performed at [facility], [address], [place], [place]   CBC with Differential/Platelet     Status: Abnormal   Collection Time: 01/20/22  5:19 AM  Result Value Ref Range   WBC 13.1 (H) 4.0 - 10.5 K/uL   RBC 3.87 3.87 - 5.11 MIL/uL   Hemoglobin 10.4 (L) 12.0 - 15.0 g/dL   HCT 01/22/22 (L) 38.4 - 66.5 %   MCV 75.7 (L) 80.0 - 100.0 fL   MCH 26.9 26.0 - 34.0 pg   MCHC 35.5 30.0 - 36.0 g/dL   RDW 99.3 (H) 57.0 - 17.7 %   Platelets 262 150 - 400 K/uL   nRBC 0.0 0.0 - 0.2 %   Neutrophils Relative % 86 %   Neutro Abs 11.1 (H) 1.7 - 7.7 K/uL   Lymphocytes Relative 8 %   Lymphs Abs 1.1 0.7 - 4.0 K/uL   Monocytes Relative 5 %   Monocytes Absolute 0.7 0.1 - 1.0 K/uL   Eosinophils Relative 0 %   Eosinophils Absolute 0.1  0.0 - 0.5 K/uL   Basophils Relative 0 %   Basophils Absolute 0.0 0.0 - 0.1 K/uL   Immature Granulocytes 1 %   Abs Immature Granulocytes 0.10 (H) 0.00 - 0.07 K/uL    Comment: Performed at [facility], [address] [place], [place] [address]    No results found.  Current scheduled medications  docusate sodium  100 mg Oral Daily   prenatal multivitamin  1 tablet Oral Q1200    I have reviewed the patient's current medications.  ASSESSMENT: Principal Problem:   Preterm premature rupture of membranes (PPROM) with unknown onset of labor   PLAN: Given new onset bleeding and her being 5 cm, will keep overnight for possible onset of labor. Patient understands that at this time no intervention can be done for benefit of the pregnancy and we would  have to wait until [redacted]w[redacted]d. If she remains stable overnight, would be a candidate for discharge to home until [redacted]w[redacted]d at which point she would be readmitted for BMZ and latency. Reviewed that most women delivery within one week of rupture but some will remain pregnant. Obviously concerned about her cervical dilation. We reviewed that if stable for home tomorrow, there would be high risk of delivery at home but this would be unfortunately not avoidable at this time. At this time no evidence for infection - WBC 13 with some left shift. We will recheck CBC tomorrow.   All questions answered for patient and support person.    Continue routine antenatal care.   Milas Hock, MD, FACOG Obstetrician & Gynecologist, [facility] for [facility], [facility]

## 2022-01-21 ENCOUNTER — Inpatient Hospital Stay (HOSPITAL_COMMUNITY): Payer: PRIVATE HEALTH INSURANCE | Admitting: Anesthesiology

## 2022-01-21 ENCOUNTER — Ambulatory Visit: Payer: No Typology Code available for payment source

## 2022-01-21 ENCOUNTER — Encounter (HOSPITAL_COMMUNITY): Payer: Self-pay | Admitting: Obstetrics & Gynecology

## 2022-01-21 DIAGNOSIS — Z3A21 21 weeks gestation of pregnancy: Secondary | ICD-10-CM

## 2022-01-21 DIAGNOSIS — O30032 Twin pregnancy, monochorionic/diamniotic, second trimester: Secondary | ICD-10-CM

## 2022-01-21 DIAGNOSIS — O411230 Chorioamnionitis, third trimester, not applicable or unspecified: Secondary | ICD-10-CM

## 2022-01-21 DIAGNOSIS — O3432 Maternal care for cervical incompetence, second trimester: Secondary | ICD-10-CM

## 2022-01-21 DIAGNOSIS — O42013 Preterm premature rupture of membranes, onset of labor within 24 hours of rupture, third trimester: Secondary | ICD-10-CM

## 2022-01-21 DIAGNOSIS — O411290 Chorioamnionitis, unspecified trimester, not applicable or unspecified: Secondary | ICD-10-CM | POA: Diagnosis present

## 2022-01-21 DIAGNOSIS — O321XX2 Maternal care for breech presentation, fetus 2: Secondary | ICD-10-CM

## 2022-01-21 LAB — LACTIC ACID, PLASMA: Lactic Acid, Venous: 1 mmol/L (ref 0.5–1.9)

## 2022-01-21 LAB — COMPREHENSIVE METABOLIC PANEL
ALT: 19 U/L (ref 0–44)
AST: 31 U/L (ref 15–41)
Albumin: 2.7 g/dL — ABNORMAL LOW (ref 3.5–5.0)
Alkaline Phosphatase: 69 U/L (ref 38–126)
Anion gap: 9 (ref 5–15)
BUN: <5 mg/dL — ABNORMAL LOW (ref 6–20)
CO2: 21 mmol/L — ABNORMAL LOW (ref 22–32)
Calcium: 8.4 mg/dL — ABNORMAL LOW (ref 8.9–10.3)
Chloride: 105 mmol/L (ref 98–111)
Creatinine, Ser: 0.76 mg/dL (ref 0.44–1.00)
GFR, Estimated: >60 mL/min (ref >60)
Glucose, Bld: 96 mg/dL (ref 70–99)
Potassium: 2.9 mmol/L — ABNORMAL LOW (ref 3.5–5.1)
Sodium: 135 mmol/L (ref 135–145)
Total Bilirubin: 0.8 mg/dL (ref 0.3–1.2)
Total Protein: 5.5 g/dL — ABNORMAL LOW (ref 6.5–8.1)

## 2022-01-21 LAB — CBC WITH DIFFERENTIAL/PLATELET
Abs Immature Granulocytes: 0.08 10*3/uL — ABNORMAL HIGH (ref 0.00–0.07)
Basophils Absolute: 0 10*3/uL (ref 0.0–0.1)
Basophils Relative: 0 %
Eosinophils Absolute: 0 10*3/uL (ref 0.0–0.5)
Eosinophils Relative: 0 %
HCT: 29.7 % — ABNORMAL LOW (ref 36.0–46.0)
Hemoglobin: 10.4 g/dL — ABNORMAL LOW (ref 12.0–15.0)
Immature Granulocytes: 1 %
Lymphocytes Relative: 6 %
Lymphs Abs: 0.9 10*3/uL (ref 0.7–4.0)
MCH: 26.1 pg (ref 26.0–34.0)
MCHC: 35 g/dL (ref 30.0–36.0)
MCV: 74.6 fL — ABNORMAL LOW (ref 80.0–100.0)
Monocytes Absolute: 0.7 10*3/uL (ref 0.1–1.0)
Monocytes Relative: 5 %
Neutro Abs: 12.4 10*3/uL — ABNORMAL HIGH (ref 1.7–7.7)
Neutrophils Relative %: 88 %
Platelets: 251 10*3/uL (ref 150–400)
RBC: 3.98 MIL/uL (ref 3.87–5.11)
RDW: 17.5 % — ABNORMAL HIGH (ref 11.5–15.5)
WBC: 14.2 10*3/uL — ABNORMAL HIGH (ref 4.0–10.5)
nRBC: 0 % (ref 0.0–0.2)

## 2022-01-21 MED ORDER — EPHEDRINE 5 MG/ML INJ: 10.0000 mg | INTRAVENOUS | Status: DC | PRN

## 2022-01-21 MED ORDER — LACTATED RINGERS IV BOLUS
500.0000 mL | Freq: Once | INTRAVENOUS | Status: AC
  Administered 2022-01-21: 500 mL via INTRAVENOUS

## 2022-01-21 MED ORDER — LIDOCAINE HCL (PF) 1 % IJ SOLN
INTRAMUSCULAR | Status: DC | PRN
  Administered 2022-01-21: 6 mL via EPIDURAL
  Administered 2022-01-21: 4 mL via EPIDURAL

## 2022-01-21 MED ORDER — OXYTOCIN-SODIUM CHLORIDE 30-0.9 UT/500ML-% IV SOLN
1.0000 m[IU]/min | INTRAVENOUS | Status: DC
  Administered 2022-01-21: 2 m[IU]/min via INTRAVENOUS
  Filled 2022-01-21: qty 500

## 2022-01-21 MED ORDER — TERBUTALINE SULFATE 1 MG/ML IJ SOLN: 0.2500 mg | Freq: Once | INTRAMUSCULAR | Status: DC | PRN

## 2022-01-21 MED ORDER — FENTANYL-BUPIVACAINE-NACL 0.5-0.125-0.9 MG/250ML-% EP SOLN
12.0000 mL/h | EPIDURAL | Status: DC | PRN
  Administered 2022-01-21: 12 mL/h via EPIDURAL

## 2022-01-21 MED ORDER — OXYCODONE-ACETAMINOPHEN 5-325 MG PO TABS: 2.0000 | ORAL_TABLET | ORAL | Status: DC | PRN

## 2022-01-21 MED ORDER — WITCH HAZEL-GLYCERIN EX PADS: 1.0000 | MEDICATED_PAD | CUTANEOUS | Status: DC | PRN

## 2022-01-21 MED ORDER — PHENYLEPHRINE 80 MCG/ML (10ML) SYRINGE FOR IV PUSH (FOR BLOOD PRESSURE SUPPORT): 80.0000 ug | PREFILLED_SYRINGE | INTRAVENOUS | Status: DC | PRN

## 2022-01-21 MED ORDER — DIBUCAINE (PERIANAL) 1 % EX OINT: 1.0000 | TOPICAL_OINTMENT | CUTANEOUS | Status: DC | PRN

## 2022-01-21 MED ORDER — FENTANYL CITRATE (PF) 100 MCG/2ML IJ SOLN
50.0000 ug | INTRAMUSCULAR | Status: DC | PRN
  Administered 2022-01-21: 100 ug via INTRAVENOUS
  Filled 2022-01-21: qty 2

## 2022-01-21 MED ORDER — PIPERACILLIN-TAZOBACTAM 3.375 G IVPB
3.3750 g | Freq: Three times a day (TID) | INTRAVENOUS | Status: AC
  Administered 2022-01-22 (×3): 3.375 g via INTRAVENOUS
  Filled 2022-01-21 (×2): qty 50

## 2022-01-21 MED ORDER — DIPHENHYDRAMINE HCL 50 MG/ML IJ SOLN: 12.5000 mg | INTRAMUSCULAR | Status: DC | PRN

## 2022-01-21 MED ORDER — MEASLES, MUMPS & RUBELLA VAC IJ SOLR: 0.5000 mL | Freq: Once | INTRAMUSCULAR | Status: DC

## 2022-01-21 MED ORDER — ZOLPIDEM TARTRATE 5 MG PO TABS: 5.0000 mg | ORAL_TABLET | Freq: Every evening | ORAL | Status: DC | PRN

## 2022-01-21 MED ORDER — PRENATAL MULTIVITAMIN CH
1.0000 | ORAL_TABLET | Freq: Every day | ORAL | Status: DC
  Administered 2022-01-22 – 2022-01-23 (×2): 1 via ORAL
  Filled 2022-01-21 (×2): qty 1

## 2022-01-21 MED ORDER — OXYTOCIN BOLUS FROM INFUSION
333.0000 mL | Freq: Once | INTRAVENOUS | Status: AC
  Administered 2022-01-21: 333 mL via INTRAVENOUS

## 2022-01-21 MED ORDER — GENTAMICIN SULFATE 40 MG/ML IJ SOLN
5.0000 mg/kg | INTRAVENOUS | Status: DC
  Filled 2022-01-21: qty 8.25

## 2022-01-21 MED ORDER — ONDANSETRON HCL 4 MG/2ML IJ SOLN: 4.0000 mg | INTRAMUSCULAR | Status: DC | PRN

## 2022-01-21 MED ORDER — COCONUT OIL OIL: 1.0000 | TOPICAL_OIL | Status: DC | PRN

## 2022-01-21 MED ORDER — LIDOCAINE HCL (PF) 1 % IJ SOLN: 30.0000 mL | INTRAMUSCULAR | Status: DC | PRN

## 2022-01-21 MED ORDER — LACTATED RINGERS IV SOLN: INTRAVENOUS | Status: DC

## 2022-01-21 MED ORDER — LACTATED RINGERS IV SOLN: 500.0000 mL | Freq: Once | INTRAVENOUS | Status: DC

## 2022-01-21 MED ORDER — OXYTOCIN-SODIUM CHLORIDE 30-0.9 UT/500ML-% IV SOLN: 2.5000 [IU]/h | INTRAVENOUS | Status: DC

## 2022-01-21 MED ORDER — ACETAMINOPHEN 325 MG PO TABS
650.0000 mg | ORAL_TABLET | ORAL | Status: DC | PRN
  Administered 2022-01-22: 650 mg via ORAL
  Filled 2022-01-21: qty 2

## 2022-01-21 MED ORDER — SENNOSIDES-DOCUSATE SODIUM 8.6-50 MG PO TABS
2.0000 | ORAL_TABLET | ORAL | Status: DC
  Administered 2022-01-22 – 2022-01-23 (×2): 2 via ORAL
  Filled 2022-01-21 (×2): qty 2

## 2022-01-21 MED ORDER — ACETAMINOPHEN 325 MG PO TABS
650.0000 mg | ORAL_TABLET | ORAL | Status: DC | PRN
  Administered 2022-01-21 (×2): 650 mg via ORAL
  Filled 2022-01-21 (×2): qty 2

## 2022-01-21 MED ORDER — FLEET ENEMA 7-19 GM/118ML RE ENEM: 1.0000 | ENEMA | Freq: Every day | RECTAL | Status: DC | PRN

## 2022-01-21 MED ORDER — FENTANYL CITRATE (PF) 100 MCG/2ML IJ SOLN: 50.0000 ug | INTRAMUSCULAR | Status: DC | PRN

## 2022-01-21 MED ORDER — OXYCODONE-ACETAMINOPHEN 5-325 MG PO TABS: 1.0000 | ORAL_TABLET | ORAL | Status: DC | PRN

## 2022-01-21 MED ORDER — SODIUM CHLORIDE 0.9 % IV SOLN: 2.0000 g | Freq: Four times a day (QID) | INTRAVENOUS | Status: DC

## 2022-01-21 MED ORDER — BENZOCAINE-MENTHOL 20-0.5 % EX AERO: 1.0000 | INHALATION_SPRAY | CUTANEOUS | Status: DC | PRN

## 2022-01-21 MED ORDER — TETANUS-DIPHTH-ACELL PERTUSSIS 5-2.5-18.5 LF-MCG/0.5 IM SUSY: 0.5000 mL | PREFILLED_SYRINGE | Freq: Once | INTRAMUSCULAR | Status: DC

## 2022-01-21 MED ORDER — DIPHENHYDRAMINE HCL 25 MG PO CAPS: 25.0000 mg | ORAL_CAPSULE | Freq: Four times a day (QID) | ORAL | Status: DC | PRN

## 2022-01-21 MED ORDER — PIPERACILLIN-TAZOBACTAM 3.375 G IVPB 30 MIN
3.3750 g | Freq: Three times a day (TID) | INTRAVENOUS | Status: DC
  Administered 2022-01-21: 3.375 g via INTRAVENOUS
  Filled 2022-01-21 (×5): qty 50

## 2022-01-21 MED ORDER — FENTANYL-BUPIVACAINE-NACL 0.5-0.125-0.9 MG/250ML-% EP SOLN
EPIDURAL | Status: AC
  Filled 2022-01-21: qty 250

## 2022-01-21 MED ORDER — ONDANSETRON HCL 4 MG PO TABS: 4.0000 mg | ORAL_TABLET | ORAL | Status: DC | PRN

## 2022-01-21 MED ORDER — HYDROXYZINE HCL 50 MG PO TABS: 50.0000 mg | ORAL_TABLET | Freq: Four times a day (QID) | ORAL | Status: DC | PRN

## 2022-01-21 MED ORDER — OXYCODONE HCL 5 MG PO TABS: 5.0000 mg | ORAL_TABLET | ORAL | Status: DC | PRN

## 2022-01-21 MED ORDER — DOCUSATE SODIUM 100 MG PO CAPS
100.0000 mg | ORAL_CAPSULE | Freq: Two times a day (BID) | ORAL | Status: DC
  Administered 2022-01-22: 100 mg via ORAL
  Filled 2022-01-21 (×3): qty 1

## 2022-01-21 MED ORDER — FENTANYL CITRATE (PF) 100 MCG/2ML IJ SOLN
INTRAMUSCULAR | Status: AC
  Administered 2022-01-21: 100 ug via INTRAVENOUS
  Filled 2022-01-21: qty 2

## 2022-01-21 MED ORDER — OXYCODONE HCL 5 MG PO TABS: 10.0000 mg | ORAL_TABLET | ORAL | Status: DC | PRN

## 2022-01-21 MED ORDER — OXYTOCIN 10 UNIT/ML IJ SOLN
INTRAMUSCULAR | Status: AC
  Filled 2022-01-21: qty 1

## 2022-01-21 MED ORDER — SIMETHICONE 80 MG PO CHEW: 80.0000 mg | CHEWABLE_TABLET | ORAL | Status: DC | PRN

## 2022-01-21 MED ORDER — SOD CITRATE-CITRIC ACID 500-334 MG/5ML PO SOLN: 30.0000 mL | ORAL | Status: DC | PRN

## 2022-01-21 MED ORDER — LACTATED RINGERS IV SOLN: 500.0000 mL | INTRAVENOUS | Status: DC | PRN

## 2022-01-21 MED ORDER — IBUPROFEN 600 MG PO TABS
600.0000 mg | ORAL_TABLET | Freq: Four times a day (QID) | ORAL | Status: DC
  Administered 2022-01-21 – 2022-01-23 (×7): 600 mg via ORAL
  Filled 2022-01-21 (×7): qty 1

## 2022-01-21 MED ORDER — OXYTOCIN 10 UNIT/ML IJ SOLN: 10.0000 [IU] | Freq: Once | INTRAMUSCULAR | Status: DC

## 2022-01-21 MED ORDER — ONDANSETRON HCL 4 MG/2ML IJ SOLN: 4.0000 mg | Freq: Four times a day (QID) | INTRAMUSCULAR | Status: DC | PRN

## 2022-01-21 NOTE — Progress Notes (Signed)
Stacy Collier is a 31 y.o. G2P0010 at [redacted]w[redacted]d admitted for previable PPROM. Patient moved from [facility] to labor and delivery due to fever and chorioamnionitis requiring IOL to protect the life of the mother.   Subjective: Patient is comfortable with an epidural now. She is interested in spiritual care.   Objective: BP 121/66   Pulse (!) 105   Temp 99.1 F (37.3 C) (Axillary)   Resp 18   Ht 5\' 3"  (1.6 m)   Wt 85.3 kg   LMP 08/25/2021 (Exact Date)   SpO2 98%   BMI 33.30 kg/m  No intake/output data recorded. No intake/output data recorded.  FHT:   NA UC:   none tracing  SVE:   Dilation: 5 Effacement (%): 80 Station: Ballotable Exam by:: Dr. [email or web address]  Labs: Lab Results  Component Value Date   WBC 14.2 (H) 01/21/2022   HGB 10.4 (L) 01/21/2022   HCT 29.7 (L) 01/21/2022   MCV 74.6 (L) 01/21/2022   PLT 251 01/21/2022    Assessment / Plan: IOL 2/2 chorioamnionitis  Previable mono/di twins  - consult to spiritual care   Labor:  continue to monitor, continue to increase pitocin  Preeclampsia:   NA Fetal Wellbeing:   NA Pain Control:  Epidural I/D:  n/a Anticipated MOD:  NSVD   01/23/2022 DNP, CNM  01/21/22  2:52 PM

## 2022-01-21 NOTE — Anesthesia Procedure Notes (Signed)
Epidural Patient location during procedure: OB Start time: 01/21/2022 1:21 PM End time: 01/21/2022 1:32 PM  Staffing Anesthesiologist: Lucretia Kern, MD Performed: anesthesiologist   Preanesthetic Checklist Completed: patient identified, IV checked, risks and benefits discussed, monitors and equipment checked, pre-op evaluation and timeout performed  Epidural Patient position: sitting Prep: DuraPrep Patient monitoring: heart rate, continuous pulse ox and blood pressure Approach: midline Location: L3-L4 Injection technique: LOR air  Needle:  Needle type: Tuohy  Needle gauge: 17 G Needle length: 9 cm Needle insertion depth: 8 cm Catheter type: closed end flexible Catheter size: 19 Gauge Catheter at skin depth: 13 cm Test dose: negative  Assessment Events: blood not aspirated, no cerebrospinal fluid, injection not painful, no injection resistance, no paresthesia and negative IV test  Additional Notes Reason for block:procedure for pain

## 2022-01-21 NOTE — Progress Notes (Signed)
Following up on a referral from [employer] this Chaplain having learned the babies had been born, checked in with pt's nurse. Pt requested visit from Chaplain. Chaplain found pt subdued and waiting to hold her babies until Chaplain arrived. Chaplain handed babies to their mother, speaking words of how beautiful they were, such precious life.  Grandmother and other family members in the room. Chaplain reminded family of the story of Jesus always wanting to have the children draw near for the [place] of [place] belongs especially to them.  Chaplain added prayers aloud to prayers the family members were offering silently.  Chaplain available throughout the night if additional support needed.  Vernell Morgans Chaplain

## 2022-01-21 NOTE — Progress Notes (Signed)
    Faculty Practice OB/GYN Attending Note  Subjective:  Called to evaluate patient with new onset of fevers and uterine contractions in the setting of known previable PPROM. Also reports abdominal pain. Scant vaginal bleeding.  Admitted on 01/19/2022 for Preterm premature rupture of membranes (PPROM) with unknown onset of labor.    Objective:   Patient Vitals for the past 24 hrs:  BP Temp Temp src Pulse Resp SpO2  01/21/22 0801 -- (!) 101.1 F (38.4 C) -- -- -- --  01/21/22 0751 (!) 116/52 (!) 101.7 F (38.7 C) Oral (!) 104 18 98 %  01/21/22 0555 (!) 111/56 99.4 F (37.4 C) Oral 98 18 98 %  01/20/22 2223 (!) 109/59 -- -- 98 -- --  01/20/22 2222 (!) 109/59 99.5 F (37.5 C) Oral -- 16 98 %  01/20/22 1833 114/69 99.6 F (37.6 C) -- 96 17 98 %  01/20/22 1352 (!) 98/53 98.6 F (37 C) Oral 91 16 99 %    FHR by doppler  Baseline 160s-170 bpm x 2 Ctxns: Reported every 4-5 minutes Gen: NAD HENT: Normocephalic, atraumatic Lungs: Normal respiratory effort Heart: Tachycardia noted Abdomen: Diffuse tenderness, gravid uterus Cervix: Dilation: 5 Effacement (%): 80 Cervical Position: Posterior Station: Ballotable Presentation: Homero Fellers Breech Exam by:: Dr. Macon Large Ext: 2+ DTRs, no edema, no cyanosis, negative Homan's sign  Assessment & Plan:  31 y.o. G2P0010 with mono-di twin gestation at [redacted]w[redacted]d  admitted for previable PPROM, now with chorioamnionitis and maternal sepsis.  Discussed this diagnosis with patient.  Discussed need for moving towards delivery in order to avoid worsening maternal sepsis.  Also reiterated that given previable gestation, no intervention will unfortunately be done for the babies.   We also discussed that she is already in preterm labor, this is inevitable at this point but augmentation is recommended to shorten duration of labor.  Talked about medicolegal implications of this, need to sign papers as recommended by the law in [place] as this is considered termination of  pregnancy.  However, there is no need to wait the 72 hours given risk to maternal health and ongoing sepsis.  Patient wants to talk to family members before initiating procedure for now.  Will proceed with IV fluids, checking labs (blood cultures x 2, CMET, lactic acid), and start Zosyn for treatment.  Fentanyl ordered prn pain.  Once patient agrees and paperwork is signed, will start augmentation with pitocin per protocol.  Continue close observation on Antenatal Unit for now. L&D provider and nursing teams are aware of this possible admission soon.    Jaynie Collins, MD, FACOG Obstetrician & Gynecologist, [facility] for [employer], [facility]

## 2022-01-21 NOTE — Anesthesia Preprocedure Evaluation (Signed)
Anesthesia Evaluation  Patient identified by MRN, date of birth, ID band Patient awake    Reviewed: Allergy & Precautions, H&P , NPO status , Patient's Chart, lab work & pertinent test results  History of Anesthesia Complications Negative for: history of anesthetic complications  Airway Mallampati: II  TM Distance: >3 FB     Dental   Pulmonary neg pulmonary ROS   Pulmonary exam normal        Cardiovascular negative cardio ROS  Rhythm:regular Rate:Normal     Neuro/Psych negative neurological ROS  negative psych ROS   GI/Hepatic negative GI ROS, Neg liver ROS,,,  Endo/Other  negative endocrine ROS    Renal/GU negative Renal ROS  negative genitourinary   Musculoskeletal   Abdominal   Peds  Hematology negative hematology ROS (+)   Anesthesia Other Findings PPROM with chorioamnionitis, 21 week twins  Reproductive/Obstetrics (+) Pregnancy                              Anesthesia Physical Anesthesia Plan  ASA: 2  Anesthesia Plan: Epidural   Post-op Pain Management:    Induction:   PONV Risk Score and Plan:   Airway Management Planned:   Additional Equipment:   Intra-op Plan:   Post-operative Plan:   Informed Consent: I have reviewed the patients History and Physical, chart, labs and discussed the procedure including the risks, benefits and alternatives for the proposed anesthesia with the patient or authorized representative who has indicated his/her understanding and acceptance.       Plan Discussed with:   Anesthesia Plan Comments:          Anesthesia Quick Evaluation

## 2022-01-21 NOTE — Progress Notes (Signed)
Evaluated patient, noted more blood loss on pad.  On exam, placenta noted to be partially delivered.  More fundal massge administered to express placenta, and patient pushed out placenta around 1903.  EBL around , Triton quantification pending.  Bimanual exam yielded some trailing membranes and small fragments.  A small curette was used to explore the uterus, no more membranes/fragments noted.  Will continue close observation for now.  Transfer to [facility] later if remains stable.    Jaynie Collins, MD, FACOG Obstetrician & Gynecologist, [facility] for [employer], [facility]

## 2022-01-21 NOTE — Progress Notes (Signed)
    Faculty Practice OB/GYN Attending Note  Subjective:  Came to evaluate patient and noticed her temperature was up to 102.8. Patient was not feeling any pelvic pressure.    Admitted on 01/19/2022 for Preterm premature rupture of membranes (PPROM) with unknown onset of labor.    Objective:  Blood pressure 121/66, pulse (!) 105, temperature (!) 102.8 F (39.3 C), temperature source Axillary, resp. rate 18, height 5\' 3"  (1.6 m), weight 85.3 kg, last menstrual period 08/25/2021, SpO2 98 %. Toco:  q2-3 mins, pitocin at 6 Gen: NAD HENT: Normocephalic, atraumatic Lungs: Normal respiratory effort Heart: Regular rate noted Abdomen: NT gravid fundus, soft Cervix: Fetal parts palpated low in vagina Ext: 2+ DTRs, no edema, no cyanosis, negative Homan's sign   Delivery Note Given that fetal parts were palpated in vagina, patient was told to push. She subsequently delivered both infants in vertex/breech presentations at 1803 and 1813. Both infants noted to be viable at time of delivery, FHR for A was 150, B 130. Cords were clamped and cut. Fundal anterior placenta noted, still tightly adherent to uterus. Fundal massage administered, and solution of pitocin 10 u in 10 ml of NS was injected into one one of the cords.  EBL noted to be around 150 ml.  Will await placental delivery  Assessment & Plan:  31 y.o. G2P0010 s/p vaginal delivery of twins at [redacted]w[redacted]d, after IOL due to intrauterine infection in the setting of previable PPROM. - Patient is surrounded by her family and with her babies. They fully understand the fetal prognosis and are appropriately very emotional - Bleeding is within normal limits for now.  Will continue to monitor for placental delivery - Patient informed of possible need for procedure in OR in the event of concerning bleeding or many hours without delivery of the placenta. - Will continue pitocin per protocol for now - Will continue Zosyn for now. Continue close  observation.   [redacted]w[redacted]d, MD, FACOG Obstetrician & Gynecologist, [facility] for [facility]

## 2022-01-21 NOTE — Progress Notes (Signed)
Had discussion with patient and her family, discussed clinical situation, and recommendation to proceed with augmentation of preterm labor in this inevitable situation due to intrauterine infection. Discussed that no fetal intervention will be done secondary to previable gestation. [facility] paperwork reviewed with patient, this was initialed and signed. Orders for augmentation placed. To L&D soon, L&D team aware. Transfer orders placed.   Jaynie Collins, MD, FACOG Obstetrician & Gynecologist, [facility] for [employer], [facility]

## 2022-01-21 NOTE — Progress Notes (Signed)
This chaplain responded to the spiritual care consult for the anticipated fetal loss of twin girls.   The Pt. is comfortable with an epidural and speaks softly to the chaplain about God's love and asks for prayer. The Pt. fiancee-Dre is at the bedside. The chaplain understands from RN-Sarah's update other family members have visited today.   Education was provided to the couple on how to reach out to the chaplain, along with an accepted invitation for F/U spiritual care on Tuesday.  Chaplain Stephanie Acre [phone]

## 2022-01-22 ENCOUNTER — Ambulatory Visit: Payer: No Typology Code available for payment source | Admitting: Clinical

## 2022-01-22 LAB — CBC
HCT: 26.4 % — ABNORMAL LOW (ref 36.0–46.0)
Hemoglobin: 9.2 g/dL — ABNORMAL LOW (ref 12.0–15.0)
MCH: 26.5 pg (ref 26.0–34.0)
MCHC: 34.8 g/dL (ref 30.0–36.0)
MCV: 76.1 fL — ABNORMAL LOW (ref 80.0–100.0)
Platelets: 218 10*3/uL (ref 150–400)
RBC: 3.47 MIL/uL — ABNORMAL LOW (ref 3.87–5.11)
RDW: 17.7 % — ABNORMAL HIGH (ref 11.5–15.5)
WBC: 18.3 10*3/uL — ABNORMAL HIGH (ref 4.0–10.5)
nRBC: 0 % (ref 0.0–0.2)

## 2022-01-22 MED ORDER — FERROUS SULFATE 325 (65 FE) MG PO TABS
325.0000 mg | ORAL_TABLET | ORAL | Status: DC
  Administered 2022-01-22: 325 mg via ORAL
  Filled 2022-01-22: qty 1

## 2022-01-22 NOTE — Anesthesia Postprocedure Evaluation (Signed)
Anesthesia Post Note  Patient: Stacy Collier  Procedure(s) Performed: AN AD HOC LABOR EPIDURAL     Patient location during evaluation: Mother Baby Anesthesia Type: Epidural Level of consciousness: awake and alert and oriented Pain management: satisfactory to patient Vital Signs Assessment: post-procedure vital signs reviewed and stable Respiratory status: respiratory function stable Cardiovascular status: stable Postop Assessment: no headache, no backache, epidural receding, patient able to bend at knees, no signs of nausea or vomiting, adequate PO intake and able to ambulate Anesthetic complications: no   No notable events documented.  Last Vitals:  Vitals:   01/22/22 0629 01/22/22 0851  BP: (!) 86/46 (!) 94/56  Pulse: 67 72  Resp: 17 15  Temp: 36.4 C (!) 36.3 C  SpO2: 100% 95%    Last Pain:  Vitals:   01/22/22 0851  TempSrc: Oral  PainSc: 0-No pain   Pain Goal:                   Juliet Vasbinder

## 2022-01-22 NOTE — Progress Notes (Signed)
CSW received consult due to IUFD.  CSW available for support secondary to Spiritual Care Services and will await call from Chaplain before becoming involved.  CSW screening out referral at this time.  Stacy Lowenstein, LCSW Clinical Social Worker [facility] Cell#: [phone]  

## 2022-01-22 NOTE — Progress Notes (Signed)
This chaplain is present with the Pt. for F/U spiritual care. The Pt. is awake and holding the two infant girls. The Pt. fiancee-Dre is at the bedside. Family joins the Pt. later in the chaplain visit.  The chaplain pauses and observes the Pt. smile as she shares the origination of the infants names with the chaplain. The chaplain offered the space for the Pt. to reflect on her thoughts.  The Pt. is comfortable with her family's presence.   The chaplain will revisit on Wednesday morning. The chaplain updated RN-Rachel on the visit before leaving the unit.   Chaplain Stephanie Acre [phone]

## 2022-01-22 NOTE — Progress Notes (Signed)
RN touched base with Chaplain on unit. Chaplain will come back to see patient this afternoon.

## 2022-01-22 NOTE — Progress Notes (Signed)
Post Partum Day 1  Subjective: Grieving appropriately.  Minimal pain reported.Tolerating food, voiding and ambulating without difficulty.  Objective: Blood pressure (!) 94/56, pulse 72, temperature (!) 97.4 F (36.3 C), temperature source Oral, resp. rate 15, height 5\' 3"  (1.6 m), weight 85.3 kg, last menstrual period 08/25/2021, SpO2 95 %, unknown if currently breastfeeding.  Physical Exam:  General: alert and no distress Lochia: appropriate Uterine Fundus: firm DVT Evaluation: No evidence of DVT seen on physical exam. Negative Homan's sign. No cords or calf tenderness.  Labs:    Latest Ref Rng & Units 01/22/2022    5:26 AM 01/21/2022    5:35 AM 01/20/2022    5:19 AM  CBC  WBC 4.0 - 10.5 K/uL 18.3  14.2  13.1   Hemoglobin 12.0 - 15.0 g/dL 9.2  01/22/2022  93.8   Hematocrit 36.0 - 46.0 % 26.4  29.7  29.3   Platelets 150 - 400 K/uL 218  251  262       Latest Ref Rng & Units 01/21/2022   10:15 AM 08/11/2019   12:00 PM  CMP  Glucose 70 - 99 mg/dL 96  93   BUN 6 - 20 mg/dL <5  18   Creatinine 10/12/2019 - 1.00 mg/dL 9.93  7.16   Sodium 9.67 - 145 mmol/L 135  138   Potassium 3.5 - 5.1 mmol/L 2.9  4.1   Chloride 98 - 111 mmol/L 105  101   CO2 22 - 32 mmol/L 21  24   Calcium 8.9 - 10.3 mg/dL 8.4  9.5   Total Protein 6.5 - 8.1 g/dL 5.5  7.5   Total Bilirubin 0.3 - 1.2 mg/dL 0.8  0.4   Alkaline Phos 38 - 126 U/L 69  96   AST 15 - 41 U/L 31  16   ALT 0 - 44 U/L 19  8     Assessment/Plan: Afebrile, last fever was 102.8 on 01/21/22 around 1800. Will continue Zosyn until at least 24 hours afebrile Analgesia as needed. Oral iron therapy ordered for acute blood loss anemia after delivery Encourage OOB Appropriate support given to patient and family. Plan for discharge tomorrow if remains stable   LOS: 2 days   01/23/22, MD 01/22/2022, 9:25 AM

## 2022-01-23 ENCOUNTER — Other Ambulatory Visit (HOSPITAL_COMMUNITY): Payer: Self-pay

## 2022-01-23 MED ORDER — IBUPROFEN 600 MG PO TABS
600.0000 mg | ORAL_TABLET | Freq: Four times a day (QID) | ORAL | 0 refills | Status: DC | PRN
  Filled 2022-01-23: qty 60, 15d supply, fill #0

## 2022-01-23 MED ORDER — FERROUS SULFATE 325 (65 FE) MG PO TABS
325.0000 mg | ORAL_TABLET | ORAL | 0 refills | Status: DC
  Filled 2022-01-23: qty 15, 30d supply, fill #0

## 2022-01-23 MED ORDER — ZOLPIDEM TARTRATE 5 MG PO TABS
5.0000 mg | ORAL_TABLET | Freq: Every evening | ORAL | 0 refills | Status: DC | PRN
  Filled 2022-01-23: qty 30, 30d supply, fill #0

## 2022-01-23 NOTE — Progress Notes (Addendum)
This chaplain is present for F/U spiritual care in the setting of preparing for discharge after twin fetal demise.  The Pt. is awake and accepting of the chaplain's visit and education on grief and healing opportunities in the community. The Pt. mother-Cherynel is at the bedside. The Pt. often looks for her mother's input and acknowledges her own story in the grief process.  The chaplain and Pt. talk in depth about the Pt. faith and the twins EOL care in the Pt. spiritual tradition. The Pt. and family have decided on a anointing and blessing by the chaplain around 4pm today. The Pt. RN-Jen confirmed the Pt. choice of funeral home.   The chaplain understands the unit is preparing the gift of hand/foot prints of the twins. The chaplain updated the unit-Rachel as she stepped away.  **1600 This chaplain is present for the blessing and anointing of the twins. With the Pt. permission, the family is assisting in dressing Aree and Chloe and placing them in the bassinet.  The chaplain and family is appreciative of the unit's care.  This chaplain is available for F/U spiritual care as needed.  Chaplain Stephanie Acre [phone]

## 2022-01-23 NOTE — Progress Notes (Signed)
Discharge instructions and prescriptions given to pt. Discussed post vaginal delivery care, signs and symptoms to report to the MD, upcoming appointments, and meds.Pt verbalizes understanding and has no questions or concerns at this time. Pt discharged home from hospital in stable condition. 

## 2022-01-23 NOTE — Discharge Summary (Signed)
Postpartum Discharge Summary    Patient Name: Stacy Collier DOB: [birth year] MRN: [id number]  Date of admission: 01/19/2022 Delivery date:   Stacy, Collier [[id number]]  01/21/2022    Stacy, Collier [[id number]]  01/21/2022  Delivering provider:    Jaine, Estabrooks [[id number]]  Jaynie Collins A    Reuter, Chauncy Lean [[id number]]  Jaynie Collins A  Date of discharge: 01/23/2022  Admitting diagnosis: Preterm premature rupture of membranes (PPROM) with unknown onset of labor [O42.919] Preterm premature rupture of membranes [O42.919] Intrauterine pregnancy: [redacted]w[redacted]d     Secondary diagnosis:  Principal Problem:   Chorioamnionitis, delivered, current hospitalization Active Problems:   Supervision of high-risk pregnancy   Twin gestation in second trimester   Preterm premature rupture of membranes (PPROM) with unknown onset of labor   Preterm labor in second trimester   Neonatal death  Additional problems: None    Discharge diagnosis: Preterm Pregnancy Delivered                                              Post partum procedures: None Augmentation: Pitocin Complications: Intrauterine Inflammation or infection (Chorioamniotis)  Hospital course:  Brief Hospital Course: Stacy Collier is a 31 y.o. G2P0112 who was admitted 01/19/2022 with preterm premature rupture of membranes (PPROM) and bleeding with mono/di twin gestation at [redacted]w[redacted]d.  She was admitted to antenatal unit and observed. On 01/21/2022, patient was noted to have fevers to 101,tachycardia and uterine tenderness consistent with intrauterine infection.  She was counseled about the need to proceed with delivery given concern about possible maternal sepsis.  Patient was already 5 cm dilated and was in labor on her own, further augmentation was done with pitocin after she was transferred to L&D.  She delivered the previable twins, they were viable at birth but passed away shortly afterwards.  Please  refer to separate delivery note for details.   Patient had an uncomplicated postpartum course.  She received antibiotics for 24 hours until afebrile.  She also received a lot of support.  By time of discharge on PPD#2, her pain was controlled on oral pain medications; she had appropriate lochia and was ambulating, voiding without difficulty, tolerating regular diet and passing flatus.  She was deemed stable for discharge to home.    Discharge Exam: Blood pressure (!) 101/58, pulse 71, temperature 98.4 F (36.9 C), temperature source Oral, resp. rate 14, height 5\' 3"  (1.6 m), weight 85.3 kg, last menstrual period 08/25/2021, SpO2 96 %, unknown if currently breastfeeding. General appearance: Alert and no distress Resp: Clear to auscultation bilaterally Cardio: Regular rate and rhythm Abdomen: Fundus below umbilicus, appropriate lochia. NT abdomen. Extremities: Extremities normal, atraumatic, no cyanosis or edema and Homans sign is negative, no sign of DVT Pulses: 2+ and symmetric Neurologic: Alert and oriented X 3, normal strength and tone. Normal symmetric reflexes. Normal coordination and gait   Physical exam  Vitals:   01/22/22 2018 01/22/22 2314 01/23/22 0429 01/23/22 0802  BP: 106/63 (!) 99/49 107/67 (!) 101/58  Pulse: 71 68 68 71  Resp: 16 17 16 14   Temp: 99.2 F (37.3 C) 99 F (37.2 C) 98.6 F (37 C) 98.4 F (36.9 C)  TempSrc: Oral Oral Oral Oral  SpO2: 100% 100% 100% 96%  Weight:      Height:       General: alert, cooperative, and no distress Lochia:  appropriate Uterine Fundus: firm Incision: N/A DVT Evaluation: No evidence of DVT seen on physical exam. Negative Homan's sign. No cords or calf tenderness. No significant calf/ankle edema. Labs: Lab Results  Component Value Date   WBC 18.3 (H) 01/22/2022   HGB 9.2 (L) 01/22/2022   HCT 26.4 (L) 01/22/2022   MCV 76.1 (L) 01/22/2022   PLT 218 01/22/2022      Latest Ref Rng & Units 01/21/2022   10:15 AM  CMP  Glucose  70 - 99 mg/dL 96   BUN 6 - 20 mg/dL <5   Creatinine 0.44 - 1.00 mg/dL 0.76   Sodium 135 - 145 mmol/L 135   Potassium 3.5 - 5.1 mmol/L 2.9   Chloride 98 - 111 mmol/L 105   CO2 22 - 32 mmol/L 21   Calcium 8.9 - 10.3 mg/dL 8.4   Total Protein 6.5 - 8.1 g/dL 5.5   Total Bilirubin 0.3 - 1.2 mg/dL 0.8   Alkaline Phos 38 - 126 U/L 69   AST 15 - 41 U/L 31   ALT 0 - 44 U/L 19    Edinburgh Score:     No data to display           After visit meds:  Allergies as of 01/23/2022   No Known Allergies      Medication List     STOP taking these medications    ELDERBERRY PO   PRENATAL PO   progesterone 200 MG capsule Commonly known as: Prometrium   TYLENOL 8 HOUR PO   zinc gluconate 50 MG tablet   ZOFRAN PO       TAKE these medications    B-6 PO Take by mouth.   FeroSul 325 (65 FE) MG tablet Generic drug: ferrous sulfate Take 1 tablet (325 mg total) by mouth every other day. Start taking on: January 24, 2022   fluticasone 50 MCG/ACT nasal spray Commonly known as: FLONASE Place 1-2 sprays into both nostrils daily.   ibuprofen 600 MG tablet Commonly known as: ADVIL Take 1 tablet (600 mg total) by mouth every 6 (six) hours as needed for mild pain, moderate pain, headache or cramping.   IRON PO Take by mouth.   Mag-Oxide 200 MG Tabs Generic drug: Magnesium Oxide -Mg Supplement Take 2 tablets (400 mg total) by mouth at bedtime. If that amount causes loose stools in the am, switch to 200mg  daily at bedtime.   zolpidem 5 MG tablet Commonly known as: AMBIEN Take 1 tablet (5 mg total) by mouth at bedtime as needed for sleep.         Discharge home in stable condition Infant [place] Discharge instruction: per After Visit Summary and Postpartum booklet. Activity: Advance as tolerated. Pelvic rest for 6 weeks.  Diet: routine diet Future Appointments  Date Time Provider [place]  02/07/2022  2:45 PM [facility] HEALTH CLINICIAN [facility]  [facility]  02/08/2022 11:15 AM Tresea Mall, CNM [facility]    01/23/2022 Verita Schneiders, MD

## 2022-01-24 LAB — SURGICAL PATHOLOGY

## 2022-01-24 NOTE — BH Specialist Note (Signed)
Integrated Behavioral Health via Telemedicine Visit  02/07/2022 Stacy Collier [id number]  Number of Integrated Behavioral Health Clinician visits: 3- Third Visit  Session Start time: 1450   Session End time: 1537  Total time in minutes: 47   Referring Provider: Edd Arbour, CNM Patient/Family location: Home [facility] Provider location: [facility]  All persons participating in visit: Patient Stacy Collier and [facility] Balsam   Types of Service: Individual psychotherapy and Video visit  I connected with Keianna S Smitherman and/or Barri S Varricchio's  n/a  via  Telephone or Video Enabled Telemedicine Application  (Video is Caregility application) and verified that I am speaking with the correct person using two identifiers. Discussed confidentiality: Yes   I discussed the limitations of telemedicine and the availability of in person appointments.  Discussed there is a possibility of technology failure and discussed alternative modes of communication if that failure occurs.  I discussed that engaging in this telemedicine visit, they consent to the provision of behavioral healthcare and the services will be billed under their insurance.  Patient and/or legal guardian expressed understanding and consented to Telemedicine visit: Yes   Presenting Concerns: Patient and/or family reports the following symptoms/concerns: Grieving loss of her twin daughters, Rosalyn Charters and Clinton Gallant, at [redacted]wks gestation. Daughters' names and gender came to her in a dream earlier in pregnancy; in the dream, pt was holding Aree; their father was holding Chloe. Pt is registered to attend Pregnancy Loss support group this month; family and friends have been very supportive. Pt is a Bursch anxious about what to expect at postpartum appointment.   Patient and/or Family's Strengths/Protective Factors: Social connections, Social and Patent attorney, Concrete supports  in place (healthy food, safe environments, etc.), Sense of purpose, and Physical Health (exercise, healthy diet, medication compliance, etc.)  Goals Addressed: Patient will:  Reduce symptoms of: anxiety and depression over time   Demonstrate ability to:  Continue healthy grieving over losses  Progress towards Goals: Ongoing  Interventions: Interventions utilized:  Supportive Counseling Standardized Assessments completed: GAD-7 and PHQ 9  Patient and/or Family Response: Patient agrees with treatment plan.   Assessment: Patient currently experiencing Grief.   Patient may benefit from continued therapeutic interventions.  Plan: Follow up with behavioral health clinician on : Two weeks Behavioral recommendations:  -Continue prioritizing healthy self-care (regular meals, adequate rest; allowing practical help from supportive friends and family) until at least postpartum medical appointment -Continue plan to attend upcoming Pregnancy Loss Support group -Continue plan to have upcoming date night with partner within one week to support one another through this time  Referral(s): Integrated [employer] (In Clinic)  I discussed the assessment and treatment plan with the patient and/or parent/guardian. They were provided an opportunity to ask questions and all were answered. They agreed with the plan and demonstrated an understanding of the instructions.   They were advised to call back or seek an in-person evaluation if the symptoms worsen or if the condition fails to improve as anticipated.  Valetta Close Livian Vanderbeck, LCSW     02/07/2022    2:57 PM 01/04/2022    9:21 AM 12/11/2021    3:03 PM 01/18/2020    3:24 PM 08/06/2019    3:40 PM  Depression screen PHQ 2/9  Decreased Interest 3 1 1  0 0  Down, Depressed, Hopeless 3 1 1  0 0  PHQ - 2 Score 6 2 2  0 0  Altered sleeping 1 1 1  0 0  Tired, decreased energy 1 1 3  0 1  Change in appetite 1 1 1  0 0  Feeling bad or failure about  yourself  1 1 1  0 0  Trouble concentrating 1 0 0 0 0  Moving slowly or fidgety/restless 1 0 1 0 0  Suicidal thoughts 0 0 0 0 0  PHQ-9 Score 12 6 9  0 1      02/07/2022    2:55 PM 01/04/2022    9:21 AM 12/11/2021    3:17 PM 01/18/2020    3:23 PM  GAD 7 : Generalized Anxiety Score  Nervous, Anxious, on Edge 0 1 1 0  Control/stop worrying 1 1 0 0  Worry too much - different things 0 1 1 0  Trouble relaxing 0 0 0 0  Restless 0 0 0 0  Easily annoyed or irritable 1 1 2  0  Afraid - awful might happen 0 0 1 0  Total GAD 7 Score 2 4 5  0

## 2022-01-26 LAB — CULTURE, BLOOD (ROUTINE X 2)
Culture: NO GROWTH
Culture: NO GROWTH
Special Requests: ADEQUATE
Special Requests: ADEQUATE

## 2022-01-31 ENCOUNTER — Telehealth (HOSPITAL_COMMUNITY): Payer: Self-pay

## 2022-01-31 NOTE — Telephone Encounter (Signed)
Patient reports doing ok. Patient has questions about lochia amounts and duration of bleeding. RN reviewed normal lochia amounts, color, and duration of bleeding. RN also reviewed what to much bleeding looks like and when to call the OB. Patient also has questions about breast care. RN reviewed wearing a tight supportive bra, using cabbage leaves, and avoiding stimulating breast tissue to aide with milk drying up. Patient asks about how long to take iron vitamins and when she can drink alcohol. RN told patient to reach out to her OB-GYN with these questions. Patient declines any other questions/concerns about her health and healing.  EPDS score is 15. Patient has an appointment with [facility] on 02/07/22 @ 1445. RN reviewed appointment information with patient. RN told patient about Maternal Mental Health Resources ([facility], National Maternal Mental Health Hotline, Postpartum Support International). Also told patient about [facility] bereavement support group. RN told patient to reach out to her OB or these resources if she feels she needs help prior to her appointment. Will e-mail these resources to patient as well.  Marcelino Duster [facility] 01/31/22,1427

## 2022-02-07 ENCOUNTER — Ambulatory Visit (INDEPENDENT_AMBULATORY_CARE_PROVIDER_SITE_OTHER): Payer: Medicaid Other | Admitting: Clinical

## 2022-02-07 DIAGNOSIS — F4321 Adjustment disorder with depressed mood: Secondary | ICD-10-CM

## 2022-02-07 NOTE — Patient Instructions (Signed)
[facility] [address] [place], [place] [address] [phone] (main office) [phone] (Anniah Glick's office)   

## 2022-02-08 ENCOUNTER — Encounter: Payer: No Typology Code available for payment source | Admitting: Advanced Practice Midwife

## 2022-02-11 ENCOUNTER — Telehealth: Payer: Self-pay

## 2022-02-11 NOTE — Telephone Encounter (Signed)
Patient called office and left VM on nurse line. Pt states she has questions regarding appt on 01/21/22 following preterm labor.

## 2022-02-12 ENCOUNTER — Encounter: Payer: Self-pay | Admitting: Certified Nurse Midwife

## 2022-02-12 ENCOUNTER — Telehealth: Payer: Self-pay

## 2022-02-12 NOTE — Telephone Encounter (Signed)
Called patient to follow up on questions regarding her preterm delivery 12/18. Patient was concerned she had not been sent home with antibiotics. Per chart review pt was treated with IV antibiotics while in hospital. Pt also had questions about her bleeding. RN discussed normal PP bleeding and precautions with patient. Patient verbalized understanding and denied further questions.

## 2022-02-14 NOTE — BH Specialist Note (Signed)
Integrated Behavioral Health via Telemedicine Visit  02/21/2022 Stacy Collier [id number]  Number of [place] Clinician visits: 4- Fourth Visit  Session Start time: 1287   Session End time: 8676  Total time in minutes: 23   Referring Provider: Gaylan Gerold, CNM Patient/Family location: Home [facility] Provider location: [facility] [place] [facility]  All persons participating in visit: Patient Stacy Collier and [place]   Types of Service: Video visit  I connected with [place] and/or [place]  n/a  via  Telephone or Video Enabled Telemedicine Application  (Video is Caregility application) and verified that I am speaking with the correct person using two identifiers. Discussed confidentiality: Yes   I discussed the limitations of telemedicine and the availability of in person appointments.  Discussed there is a possibility of technology failure and discussed alternative modes of communication if that failure occurs.  I discussed that engaging in this telemedicine visit, they consent to the provision of behavioral healthcare and the services will be billed under their insurance.  Patient and/or legal guardian expressed understanding and consented to Telemedicine visit: Yes   Presenting Concerns: Patient and/or family reports the following symptoms/concerns: Continuing to grieve losses while slowly adjusting to her new normal some days Duration of problem: Over one month since loss  Patient and/or Family's Strengths/Protective Factors: Social connections, Concrete supports in place (healthy food, safe environments, etc.), Sense of purpose, and Physical Health (exercise, healthy diet, medication compliance, etc.)  Goals Addressed: Patient will:    Demonstrate ability to:  Continue healthy grieving over losses  Progress towards Goals: Ongoing  Interventions: Interventions utilized:   Supportive Reflection Standardized Assessments completed: Not Needed  Patient and/or Family Response: Patient agrees with treatment plan.   Assessment: Patient currently experiencing Grief .   Patient may benefit from continued therapeutic interventions .  Plan: Follow up with behavioral health clinician on : Call Ishaq Maffei at [phone], as needed. Behavioral recommendations:  -Continue prioritizing healthy self-care (regular meals, adequate rest; allowing practical help from supportive friends and family) until at least postpartum medical appointment -Reschedule Pregnancy Loss Support Group, as planned -Continue plan to reschedule date night tonight, or next good-weather night available -Say yes to friends offering to take out this week for extra support(remind yourself it's okay to allow yourself to be happy)  Referral(s): [employer] (In Clinic) and [employer]:  pregnancy loss support  I discussed the assessment and treatment plan with the patient and/or parent/guardian. They were provided an opportunity to ask questions and all were answered. They agreed with the plan and demonstrated an understanding of the instructions.   They were advised to call back or seek an in-person evaluation if the symptoms worsen or if the condition fails to improve as anticipated.  [place], LCSW     02/07/2022    2:57 PM 01/04/2022    9:21 AM 12/11/2021    3:03 PM 01/18/2020    3:24 PM 08/06/2019    3:40 PM  Depression screen PHQ 2/9  Decreased Interest 3 1 1  0 0  Down, Depressed, Hopeless 3 1 1  0 0  PHQ - 2 Score 6 2 2  0 0  Altered sleeping 1 1 1  0 0  Tired, decreased energy 1 1 3  0 1  Change in appetite 1 1 1  0 0  Feeling bad or failure about yourself  1 1 1  0 0  Trouble concentrating 1 0 0 0  0  Moving slowly or fidgety/restless 1 0 1 0 0  Suicidal thoughts 0 0 0 0 0  PHQ-9 Score 12 6 9  0 1      02/07/2022    2:55 PM 01/04/2022    9:21 AM 12/11/2021     3:17 PM 01/18/2020    3:23 PM  GAD 7 : Generalized Anxiety Score  Nervous, Anxious, on Edge 0 1 1 0  Control/stop worrying 1 1 0 0  Worry too much - different things 0 1 1 0  Trouble relaxing 0 0 0 0  Restless 0 0 0 0  Easily annoyed or irritable 1 1 2  0  Afraid - awful might happen 0 0 1 0  Total GAD 7 Score 2 4 5  0

## 2022-02-21 ENCOUNTER — Ambulatory Visit (INDEPENDENT_AMBULATORY_CARE_PROVIDER_SITE_OTHER): Payer: Medicaid Other | Admitting: Clinical

## 2022-02-21 DIAGNOSIS — F4321 Adjustment disorder with depressed mood: Secondary | ICD-10-CM | POA: Diagnosis not present

## 2022-02-21 NOTE — Patient Instructions (Signed)
[facility] [place] [facility] [address] [place], [place] [address] [phone] (main office) [phone] (Jennie Hannay's office)   

## 2022-03-06 NOTE — Progress Notes (Unsigned)
[place] Part[facility] Visit Note  Stacy Collier is a 32 y.o. [phone] female who presents for a postpartum visit. She is 5 weeks postpartum following a normal spontaneous vaginal delivery.  I have fully reviewed the prenatal and intrapartum course. The delivery was at 21.2 gestational weeks with mono- di twins Anesthesia: epidural. Postpartum course has been uneventful. Bleeding staining only. Bowel function is normal. Bladder function is normal. Patient is not sexually active. Contraception method is none. Postpartum depression screening: positive.   The pregnancy intention screening data noted above was reviewed. Potential methods of contraception were discussed. The patient elected to proceed with No data recorded.   Edinburgh Postnatal Depression Scale - 03/07/22 1031       Edinburgh Postnatal Depression Scale:  In the Past 7 Days   I have been able to laugh and see the funny side of things. 2    I have looked forward with enjoyment to things. 3    I have blamed myself unnecessarily when things went wrong. 1    I have been anxious or worried for no good reason. 2    I have felt scared or panicky for no good reason. 2    Things have been getting on top of me. 2    I have been so unhappy that I have had difficulty sleeping. 2    I have felt sad or miserable. 3    I have been so unhappy that I have been crying. 3    The thought of harming myself has occurred to me. 0    Edinburgh Postnatal Depression Scale Total 20             Health Maintenance Due  Topic Date Due   COVID-19 Vaccine (1) Never done   DTaP/Tdap/Td (1 - Tdap) Never done   PAP-Cervical Cytology Screening  Never done   PAP SMEAR-Modifier  Never done   INFLUENZA VACCINE  Never done    The following portions of the patient's history were reviewed and updated as appropriate: allergies, current medications, past family history, past medical history, past social history, past surgical history, and problem list.  Review  of Systems Pertinent items noted in HPI and remainder of comprehensive ROS otherwise negative.  Objective:  BP 117/85   Pulse 80   Wt 181 lb 3.5 oz (82.2 kg)   Breastfeeding No   BMI 32.10 kg/m    General:  alert, cooperative, and appears stated age   Breasts:  not indicated  Lungs: Comfortalbe on room air  Wound N/a  GU exam:  not indicated        Assessment:    There are no diagnoses linked to this encounter.  Normal postpartum exam.   Plan:   Essential components of care per ACOG recommendations:  1.  Mood and well being: Patient with positive depression screening today. Reviewed local resources for support. - Patient tobacco use? No.   - hx of drug use? No.   - needs additional two weeks of leave for mental health, message sent to have paperwork completed - following w Stacy Collier, also has her own therapist she will start seeing next week  2. Infant care and feeding:  -Patient currently breastmilk feeding? No.  -Social determinants of health (SDOH) reviewed in EPIC. The following needs were identified: food insecurity  3. Sexuality, contraception and birth spacing - Patient does not want a pregnancy in the next year.  Desired family size is unsure # children.  - Reviewed  reproductive life planning. Reviewed contraceptive methods based on pt preferences and effectiveness.  Patient declined contraception today.   - Discussed birth spacing of 18 months  4. Sleep and fatigue -Encouraged family/partner/community support of 4 hrs of uninterrupted sleep to help with mood and fatigue  5. Physical Recovery  - Discussed patients delivery and complications. She describes her labor as bad. - Patient had a Vaginal, no problems at delivery. Patient had no laceration. Perineal healing reviewed. Patient expressed understanding - Patient has urinary incontinence? No. - Patient is safe to resume physical and sexual activity - SB20 paperwork completed and forwarded to appropriate  parties  6.  Health Maintenance - HM due items addressed Yes - Last pap smear No results found for: "DIAGPAP" Pap smear not done at today's visit. Reports that she had normal pap done at [facility] in 11/2021, will do ROI for records.  -Breast Cancer screening indicated? No.   7. Chronic Disease/Pregnancy Condition follow up: None  - PCP follow up  Clarnce Flock, MD/MPH Attending Family Medicine Physician, [facility] for [facility], [place]

## 2022-03-07 ENCOUNTER — Other Ambulatory Visit: Payer: Self-pay

## 2022-03-07 ENCOUNTER — Encounter: Payer: Self-pay | Admitting: Family Medicine

## 2022-03-07 ENCOUNTER — Ambulatory Visit (INDEPENDENT_AMBULATORY_CARE_PROVIDER_SITE_OTHER): Payer: Medicaid Other | Admitting: Family Medicine

## 2022-03-07 ENCOUNTER — Telehealth: Payer: Self-pay | Admitting: Family Medicine

## 2022-03-07 VITALS — BP 117/85 | HR 80 | Wt 181.2 lb

## 2022-03-07 DIAGNOSIS — F53 Postpartum depression: Secondary | ICD-10-CM

## 2022-03-07 DIAGNOSIS — O42919 Preterm premature rupture of membranes, unspecified as to length of time between rupture and onset of labor, unspecified trimester: Secondary | ICD-10-CM

## 2022-03-07 NOTE — Telephone Encounter (Signed)
Patient employer is requesting a letter for an extension of leave for work that was discussed during her appt today. Would like a call back

## 2022-03-08 NOTE — Telephone Encounter (Signed)
Call placed back to pt. No answer, left VM for pt to be advised that I have sent letter for return to work on 03/21/22, ok per Dr Dione Plover.   Colletta [place], RNC

## 2022-03-11 ENCOUNTER — Telehealth: Payer: Self-pay | Admitting: Family Medicine

## 2022-03-11 NOTE — Telephone Encounter (Signed)
-----   Message from Clarnce Flock, MD sent at 03/07/2022 11:07 AM EST ----- Regarding: Disability paperwork Just saw patient for a postpartum visit, she would like two additional weeks of disability leave.  Thanks, [facility]

## 2022-03-11 NOTE — Telephone Encounter (Signed)
Attempted to reach patient about her request for additional two weeks leave. Time was approved by Dr. Dione Plover.

## 2022-03-13 ENCOUNTER — Ambulatory Visit: Payer: No Typology Code available for payment source | Admitting: Physician Assistant

## 2022-03-13 ENCOUNTER — Encounter: Payer: Self-pay | Admitting: Physician Assistant

## 2022-03-13 VITALS — BP 118/81 | HR 66 | Ht 63.0 in | Wt 184.4 lb

## 2022-03-13 DIAGNOSIS — F4321 Adjustment disorder with depressed mood: Secondary | ICD-10-CM | POA: Diagnosis not present

## 2022-03-13 DIAGNOSIS — F4323 Adjustment disorder with mixed anxiety and depressed mood: Secondary | ICD-10-CM | POA: Diagnosis not present

## 2022-03-13 MED ORDER — FLUOXETINE HCL 20 MG PO CAPS: 20.0000 mg | ORAL_CAPSULE | Freq: Every day | ORAL | 1 refills | Status: DC

## 2022-03-13 NOTE — Progress Notes (Signed)
[facility] Check  Patient ID: Stacy Collier,  MRN: [id number]  PCP: Gildardo Pounds, NP  Date of Evaluation: 03/13/2022 Time spent:40 minutes  Chief Complaint:  Chief Complaint   Establish Care    HISTORY/CURRENT STATUS: HPI Lost to f/u, LOV was 09/23/2019  She is here to discuss symptoms of depression. She was [redacted] weeks gestation with identical twins when she had preterm labor with premature rupture of membranes, admitted 01/19/2022.  On 01/21/2022 she had fever, tachycardia, intrauterine infection and had dilated to 5 cm, within just a few hours, identical twins were born at 21 weeks and 2 days gestation.  She named them Aree and Chloe, both twins took a few breaths and passed away shortly after birth.  She was treated with IV antibiotics for several days and had no postpartum complications.    Her twins were due June 01, 2022 which is hard, she should be excited about the upcoming birth, instead she is very sad.  Also her sister is pregnant, due at almost the same time she was.  Even though she is happy for her, it makes it more sad for herself.  She has been out of work since December, scheduled to go back next week.  Dreading it a lot, that causes anxiety.   She cries easily. Sleeping better than she did when she first got home.  Not having nightmares.  Energy and motivation are good for the most part.  Her appetite is better.  ADLs and personal hygiene are normal.  Denies suicidal or homicidal thoughts.  Patient denies increased energy with decreased need for sleep, increased talkativeness, racing thoughts, impulsivity or risky behaviors, increased spending, increased libido, grandiosity, increased irritability or anger, paranoia, or hallucinations.  Review of Systems  Constitutional: Negative.   HENT: Negative.    Eyes: Negative.   Respiratory: Negative.    Cardiovascular: Negative.   Gastrointestinal: Negative.   Genitourinary: Negative.   Musculoskeletal:  Negative.   Skin: Negative.   Neurological: Negative.   Endo/Heme/Allergies: Negative.   Psychiatric/Behavioral:         See HPI   Individual Medical History/ Review of Systems: Changes? :No    Past medications for mental health diagnoses include: Trazodone  Allergies: Patient has no known allergies.  Current Medications:  Current Outpatient Medications:    FLUoxetine (PROZAC) 20 MG capsule, Take 1 capsule (20 mg total) by mouth daily., Disp: 30 capsule, Rfl: 1   Ferrous Sulfate (IRON PO), Take by mouth. (Patient not taking: Reported on 03/13/2022), Disp: , Rfl:    ferrous sulfate 325 (65 FE) MG tablet, Take 1 tablet (325 mg total) by mouth every other day. (Patient not taking: Reported on 03/13/2022), Disp: 30 tablet, Rfl: 0   fluticasone (FLONASE) 50 MCG/ACT nasal spray, Place 1-2 sprays into both nostrils daily. (Patient not taking: Reported on 03/07/2022), Disp: 11.1 mL, Rfl: 2   ibuprofen (ADVIL) 600 MG tablet, Take 1 tablet (600 mg total) by mouth every 6 (six) hours as needed for mild pain, moderate pain, headache or cramping. (Patient not taking: Reported on 03/07/2022), Disp: 60 tablet, Rfl: 0   Magnesium Oxide -Mg Supplement (MAG-OXIDE) 200 MG TABS, Take 2 tablets (400 mg total) by mouth at bedtime. If that amount causes loose stools in the am, switch to 200mg  daily at bedtime. (Patient not taking: Reported on 03/07/2022), Disp: 60 tablet, Rfl: 3   Pyridoxine HCl (B-6 PO), Take by mouth. (Patient not taking: Reported on 03/07/2022), Disp: , Rfl:  zolpidem (AMBIEN) 5 MG tablet, Take 1 tablet (5 mg total) by mouth at bedtime as needed for sleep. (Patient not taking: Reported on 03/07/2022), Disp: 30 tablet, Rfl: 0 Medication Side Effects: none  Family Medical/ Social History: Changes? Yes see HPI  MENTAL HEALTH EXAM:  Blood pressure 118/81, pulse 66, height 5\' 3"  (1.6 m), weight 184 lb 6.4 oz (83.6 kg), not currently breastfeeding.Body mass index is 32.66 kg/m.  General Appearance:  Casual, Neat and Well Groomed  Eye Contact:  Good  Speech:  Clear and Coherent and Normal Rate  Volume:  Normal  Mood:  Depressed  Affect:  Depressed  Thought Process:  Goal Directed and Descriptions of Associations: Intact  Orientation:  Full (Time, Place, and Person)  Thought Content: Logical   Suicidal Thoughts:  No  Homicidal Thoughts:  No  Memory:  WNL  Judgement:  Good  Insight:  Good  Psychomotor Activity:  Normal  Concentration:  Concentration: Good  Recall:  Good  Fund of Knowledge: Good  Language: Good  Assets:  Desire for Improvement  ADL's:  Intact  Cognition: WNL  Prognosis:  Good   DIAGNOSES:    ICD-10-CM   1. Situational mixed anxiety and depressive disorder  F43.23     2. Grief associated with loss of fetus  F43.21      Receiving Psychotherapy: No   RECOMMENDATIONS:  PDMP was reviewed.  Ambien filled 01/26/2022. I provided 40 minutes of face to face time during this encounter, including time spent before and after the visit in records review, medical decision making, counseling pertinent to today's visit, and charting.   My condolences in the passing of her daughters.  We discussed different treatment options.  I recommend starting an antidepressant.  This can help both the anxiety and depression.  Discussed the SSRIs, recommend starting Prozac, benefits, risks and side effects were discussed and she accepts.  She seems to be sleeping okay right now so I will not prescribe anything for that.  Reports no panic attacks so no specific treatment for rescue. It is hard to say whether she will be ready to go back to work next week.  If she becomes more emotional and does not feel that she can try going back to work, will keep her out on FMLA until our next appointment.  Start Prozac 20 mg, 1 p.o. daily. Refer to [facility]. Return in 4 weeks.  Donnal Moat, PA-C

## 2022-03-21 ENCOUNTER — Ambulatory Visit: Payer: No Typology Code available for payment source | Admitting: Behavioral Health

## 2022-03-28 ENCOUNTER — Ambulatory Visit: Payer: No Typology Code available for payment source | Admitting: Family Medicine

## 2022-03-28 ENCOUNTER — Encounter: Payer: Self-pay | Admitting: Family Medicine

## 2022-03-28 ENCOUNTER — Other Ambulatory Visit: Payer: Self-pay

## 2022-03-28 DIAGNOSIS — N921 Excessive and frequent menstruation with irregular cycle: Secondary | ICD-10-CM | POA: Diagnosis not present

## 2022-03-28 DIAGNOSIS — Z0289 Encounter for other administrative examinations: Secondary | ICD-10-CM

## 2022-03-28 NOTE — Progress Notes (Signed)
   GYNECOLOGY OFFICE VISIT NOTE  History:   Stacy Collier is a 32 y.o. [id number] here today for ongoing bleeding.  Last seen on 03/07/2022 for postpartum visit after traumatic delivery at 21 weeks with mono/di twins on 01/21/2022, neither of which survived Review of delivery note shows there was some trailing membranes, banjo curettage performed  Reports she has ongoing bleeding since delivery She can't quite remember but thinks she has been bleeding continuously since delivery Since our last visit had what seemed like a heavier flow similar to a period This eased up some, got worse again, and now today has eased up again Intermittent mild abdominal cramps  Health Maintenance Due  Topic Date Due   COVID-19 Vaccine (1) Never done   DTaP/Tdap/Td (1 - Tdap) Never done   INFLUENZA VACCINE  Never done    Past Medical History:  Diagnosis Date   Anxiety    Depression    Miscarriage at 8 to [redacted] weeks gestation     Past Surgical History:  Procedure Laterality Date   WISDOM TOOTH EXTRACTION      The following portions of the patient's history were reviewed and updated as appropriate: allergies, current medications, past family history, past medical history, past social history, past surgical history and problem list.   Health Maintenance:   Last pap: No results found for: "DIAGPAP", "HPV", "[place]" 11/05/2021 - ASCUS, neg HPV at [facility], records in media tab  Last mammogram:  N/a    Review of Systems:  Pertinent items noted in HPI and remainder of comprehensive ROS otherwise negative.  Physical Exam:  BP 117/80   Pulse 94   Wt 187 lb (84.8 kg)   Breastfeeding No   BMI 33.13 kg/m  CONSTITUTIONAL: Well-developed, well-nourished female in no acute distress.  HEENT:  Normocephalic, atraumatic. External right and left ear normal. No scleral icterus.  NECK: Normal range of motion, supple, no masses noted on observation SKIN: No rash noted. Not diaphoretic. No  erythema. No pallor. MUSCULOSKELETAL: Normal range of motion. No edema noted. NEUROLOGIC: Alert and oriented to person, place, and time. Normal muscle tone coordination.  PSYCHIATRIC: Normal mood and affect. Normal behavior. Normal judgment and thought content. RESPIRATORY: Effort normal, no problems with respiration noted ABDOMEN: No masses noted. No other overt distention noted.   Labs and Imaging No results found for this or any previous visit (from the past 168 hour(s)). No results found.    Assessment and Plan:   Problem List Items Addressed This Visit       Genitourinary   Postpartum hemorrhage - Primary    History concerning for retained products of conception, will obtain TVUS to evaluate. Declines any intervention at this time, would prefer to wait for [place] results.       Relevant Orders   US PELVIC COMPLETE WITH TRANSVAGINAL    Routine preventative health maintenance measures emphasized. Please refer to After Visit Summary for other counseling recommendations.   Return pending ultrasound results.    Total face-to-face time with patient: 20 minutes.  Over 50% of encounter was spent on counseling and coordination of care.   Clarnce Flock, MD/MPH Attending Family Medicine Physician, [facility] for [facility], [place]

## 2022-03-28 NOTE — Assessment & Plan Note (Signed)
History concerning for retained products of conception, will obtain TVUS to evaluate. Declines any intervention at this time, would prefer to wait for [place] results.

## 2022-04-02 ENCOUNTER — Ambulatory Visit
Admission: RE | Admit: 2022-04-02 | Discharge: 2022-04-02 | Disposition: A | Discharge status: Home / Self Care | Payer: Medicaid Other | Source: Ambulatory Visit | Attending: Family Medicine | Admitting: Family Medicine

## 2022-04-03 ENCOUNTER — Telehealth: Payer: Self-pay | Admitting: Family Medicine

## 2022-04-03 NOTE — Telephone Encounter (Signed)
Attempted to call patient, no answer. Left voicemail stating I would send her a detailed MyChart message.

## 2022-04-05 ENCOUNTER — Telehealth: Payer: Self-pay | Admitting: Family Medicine

## 2022-04-05 NOTE — Telephone Encounter (Signed)
Called patient and identified with two markers.  She had questions regarding MVA vs D&C for retained POC's, after discussion her priority is comfort during the procedure. She elects for D&C.  Message sent to schedule. All questions answered.

## 2022-04-05 NOTE — Telephone Encounter (Signed)
Patient called in with some questions about her results.

## 2022-04-08 ENCOUNTER — Telehealth: Payer: Self-pay

## 2022-04-08 ENCOUNTER — Other Ambulatory Visit: Payer: Self-pay

## 2022-04-08 ENCOUNTER — Encounter (HOSPITAL_BASED_OUTPATIENT_CLINIC_OR_DEPARTMENT_OTHER): Payer: Self-pay | Admitting: Obstetrics and Gynecology

## 2022-04-08 NOTE — Progress Notes (Signed)
Spoke w/ via phone for pre-op interview--- pt Lab needs dos----  Waiting on physician orders             Lab results------ COVID test -----patient states asymptomatic no test needed Arrive at ------- 1100  NPO after MN NO Solid Food.  Clear liquids from MN until--- 1000 Med rec completed Medications to take morning of surgery ----- N/A Diabetic medication ----- N/A Patient instructed no nail polish to be worn day of surgery Patient instructed to bring photo id and insurance card day of surgery Patient aware to have Driver (ride ) / caregiver    for 24 hours after surgery: Stacy Collier- Patient stated that she does not have a ride set in stone currently and the person listed could change.  Patient Special Instructions -----  Pre-Op special Istructions ----- Patient verbalized understanding of instructions that were given at this phone interview. Patient denies shortness of breath, chest pain, fever, cough at this phone interview.

## 2022-04-08 NOTE — Telephone Encounter (Signed)
Called patient, surgery date,time, location, and preop instructions was already given by preop nurse. Asked patient if she had any additional questions (she did not). Advised patient to give [place] a call back if she needed clarity on anything prior to surgery. Patient expressed understanding.

## 2022-04-10 ENCOUNTER — Ambulatory Visit (HOSPITAL_BASED_OUTPATIENT_CLINIC_OR_DEPARTMENT_OTHER): Payer: Medicaid Other | Admitting: Anesthesiology

## 2022-04-10 ENCOUNTER — Other Ambulatory Visit: Payer: Self-pay

## 2022-04-10 ENCOUNTER — Encounter (HOSPITAL_BASED_OUTPATIENT_CLINIC_OR_DEPARTMENT_OTHER): Payer: Self-pay | Admitting: Obstetrics and Gynecology

## 2022-04-10 ENCOUNTER — Encounter (HOSPITAL_BASED_OUTPATIENT_CLINIC_OR_DEPARTMENT_OTHER)
Admission: RE | Disposition: A | Discharge status: Home / Self Care | Payer: Self-pay | Source: Ambulatory Visit | Attending: Obstetrics and Gynecology

## 2022-04-10 ENCOUNTER — Ambulatory Visit (HOSPITAL_BASED_OUTPATIENT_CLINIC_OR_DEPARTMENT_OTHER)
Admission: RE | Admit: 2022-04-10 | Discharge: 2022-04-10 | Disposition: A | Discharge status: Home / Self Care | Payer: Medicaid Other | Source: Ambulatory Visit | Attending: Obstetrics and Gynecology | Admitting: Obstetrics and Gynecology

## 2022-04-10 DIAGNOSIS — O021 Missed abortion: Secondary | ICD-10-CM | POA: Insufficient documentation

## 2022-04-10 DIAGNOSIS — Z01818 Encounter for other preprocedural examination: Secondary | ICD-10-CM

## 2022-04-10 DIAGNOSIS — O034 Incomplete spontaneous abortion without complication: Secondary | ICD-10-CM | POA: Diagnosis present

## 2022-04-10 DIAGNOSIS — F32A Depression, unspecified: Secondary | ICD-10-CM | POA: Diagnosis not present

## 2022-04-10 DIAGNOSIS — O42919 Preterm premature rupture of membranes, unspecified as to length of time between rupture and onset of labor, unspecified trimester: Secondary | ICD-10-CM

## 2022-04-10 DIAGNOSIS — Z3A00 Weeks of gestation of pregnancy not specified: Secondary | ICD-10-CM | POA: Diagnosis not present

## 2022-04-10 DIAGNOSIS — F419 Anxiety disorder, unspecified: Secondary | ICD-10-CM | POA: Insufficient documentation

## 2022-04-10 DIAGNOSIS — Z3A21 21 weeks gestation of pregnancy: Secondary | ICD-10-CM | POA: Diagnosis not present

## 2022-04-10 HISTORY — PX: DILATION AND EVACUATION: SHX1459

## 2022-04-10 LAB — TYPE AND SCREEN
ABO/RH(D): O POS
Antibody Screen: NEGATIVE

## 2022-04-10 SURGERY — DILATION AND EVACUATION, UTERUS
Anesthesia: General | Site: Uterus

## 2022-04-10 MED ORDER — FENTANYL CITRATE (PF) 100 MCG/2ML IJ SOLN
INTRAMUSCULAR | Status: AC
  Filled 2022-04-10: qty 2

## 2022-04-10 MED ORDER — ONDANSETRON HCL 4 MG/2ML IJ SOLN
INTRAMUSCULAR | Status: AC
  Filled 2022-04-10: qty 6

## 2022-04-10 MED ORDER — LIDOCAINE HCL (PF) 2 % IJ SOLN
INTRAMUSCULAR | Status: AC
  Filled 2022-04-10: qty 10

## 2022-04-10 MED ORDER — PROPOFOL 10 MG/ML IV BOLUS
INTRAVENOUS | Status: AC
  Filled 2022-04-10: qty 20

## 2022-04-10 MED ORDER — PROPOFOL 10 MG/ML IV BOLUS
INTRAVENOUS | Status: DC | PRN
  Administered 2022-04-10: 170 mg via INTRAVENOUS

## 2022-04-10 MED ORDER — LIDOCAINE 2% (20 MG/ML) 5 ML SYRINGE
INTRAMUSCULAR | Status: DC | PRN
  Administered 2022-04-10: 40 mg via INTRAVENOUS

## 2022-04-10 MED ORDER — LACTATED RINGERS IV SOLN: INTRAVENOUS | Status: DC

## 2022-04-10 MED ORDER — MIDAZOLAM HCL 2 MG/2ML IJ SOLN
INTRAMUSCULAR | Status: AC
  Filled 2022-04-10: qty 2

## 2022-04-10 MED ORDER — OXYCODONE HCL 5 MG/5ML PO SOLN: 5.0000 mg | Freq: Once | ORAL | Status: DC | PRN

## 2022-04-10 MED ORDER — FENTANYL CITRATE (PF) 100 MCG/2ML IJ SOLN: 25.0000 ug | INTRAMUSCULAR | Status: DC | PRN

## 2022-04-10 MED ORDER — LIDOCAINE-EPINEPHRINE 1 %-1:100000 IJ SOLN
INTRAMUSCULAR | Status: DC | PRN
  Administered 2022-04-10: 20 mL

## 2022-04-10 MED ORDER — FENTANYL CITRATE (PF) 250 MCG/5ML IJ SOLN
INTRAMUSCULAR | Status: DC | PRN
  Administered 2022-04-10 (×2): 50 ug via INTRAVENOUS

## 2022-04-10 MED ORDER — 0.9 % SODIUM CHLORIDE (POUR BTL) OPTIME
TOPICAL | Status: DC | PRN
  Administered 2022-04-10: 500 mL

## 2022-04-10 MED ORDER — ACETAMINOPHEN 500 MG PO TABS: 500.0000 mg | ORAL_TABLET | Freq: Four times a day (QID) | ORAL | 0 refills | Status: AC | PRN

## 2022-04-10 MED ORDER — DEXMEDETOMIDINE HCL IN NACL 80 MCG/20ML IV SOLN
INTRAVENOUS | Status: AC
  Filled 2022-04-10: qty 20

## 2022-04-10 MED ORDER — DEXAMETHASONE SODIUM PHOSPHATE 10 MG/ML IJ SOLN
INTRAMUSCULAR | Status: DC | PRN
  Administered 2022-04-10: 10 mg via INTRAVENOUS

## 2022-04-10 MED ORDER — IBUPROFEN 600 MG PO TABS: 600.0000 mg | ORAL_TABLET | Freq: Four times a day (QID) | ORAL | 2 refills | Status: AC | PRN

## 2022-04-10 MED ORDER — DOXYCYCLINE HYCLATE 100 MG IV SOLR
200.0000 mg | INTRAVENOUS | Status: AC
  Administered 2022-04-10: 200 mg via INTRAVENOUS
  Filled 2022-04-10: qty 200

## 2022-04-10 MED ORDER — ACETAMINOPHEN 500 MG PO TABS: 1000.0000 mg | ORAL_TABLET | ORAL | Status: DC

## 2022-04-10 MED ORDER — ROCURONIUM BROMIDE 10 MG/ML (PF) SYRINGE
PREFILLED_SYRINGE | INTRAVENOUS | Status: AC
  Filled 2022-04-10: qty 20

## 2022-04-10 MED ORDER — MIDAZOLAM HCL 2 MG/2ML IJ SOLN
INTRAMUSCULAR | Status: DC | PRN
  Administered 2022-04-10: 2 mg via INTRAVENOUS

## 2022-04-10 MED ORDER — ONDANSETRON HCL 4 MG/2ML IJ SOLN
INTRAMUSCULAR | Status: DC | PRN
  Administered 2022-04-10: 4 mg via INTRAVENOUS

## 2022-04-10 MED ORDER — CELECOXIB 200 MG PO CAPS
ORAL_CAPSULE | ORAL | Status: AC
  Filled 2022-04-10: qty 1

## 2022-04-10 MED ORDER — OXYCODONE HCL 5 MG PO TABS: 5.0000 mg | ORAL_TABLET | Freq: Once | ORAL | Status: DC | PRN

## 2022-04-10 MED ORDER — ACETAMINOPHEN 500 MG PO TABS
ORAL_TABLET | ORAL | Status: AC
  Filled 2022-04-10: qty 2

## 2022-04-10 MED ORDER — ACETAMINOPHEN 500 MG PO TABS
1000.0000 mg | ORAL_TABLET | Freq: Once | ORAL | Status: AC
  Administered 2022-04-10: 1000 mg via ORAL

## 2022-04-10 MED ORDER — CELECOXIB 200 MG PO CAPS
200.0000 mg | ORAL_CAPSULE | Freq: Once | ORAL | Status: AC
  Administered 2022-04-10: 200 mg via ORAL

## 2022-04-10 SURGICAL SUPPLY — 24 items
CATH ROBINSON RED A/P 16FR (CATHETERS) ×2 IMPLANT
DRSG TELFA 3X8 NADH STRL (GAUZE/BANDAGES/DRESSINGS) ×2 IMPLANT
FILTER UTR ASPR ASSEMBLY (MISCELLANEOUS) ×2 IMPLANT
GAUZE 4X4 16PLY ~~LOC~~+RFID DBL (SPONGE) ×2 IMPLANT
GLOVE BIO SURGEON STRL SZ7 (GLOVE) ×2 IMPLANT
GLOVE BIOGEL PI IND STRL 7.0 (GLOVE) ×2 IMPLANT
GOWN STRL REUS W/TWL XL LVL3 (GOWN DISPOSABLE) ×2 IMPLANT
HOSE CONNECTING 18IN BERKELEY (TUBING) ×2 IMPLANT
KIT BERKELEY 1ST TRIMESTER 3/8 (MISCELLANEOUS) ×4 IMPLANT
KIT TURNOVER CYSTO (KITS) ×2 IMPLANT
NS IRRIG 500ML POUR BTL (IV SOLUTION) ×2 IMPLANT
PACK VAGINAL MINOR WOMEN LF (CUSTOM PROCEDURE TRAY) ×2 IMPLANT
PAD OB MATERNITY 4.3X12.25 (PERSONAL CARE ITEMS) ×2 IMPLANT
PAD PREP 24X48 CUFFED NSTRL (MISCELLANEOUS) ×2 IMPLANT
SCOPETTES 8  STERILE (MISCELLANEOUS)
SCOPETTES 8 STERILE (MISCELLANEOUS) IMPLANT
SET BERKELEY SUCTION TUBING (SUCTIONS) ×2 IMPLANT
SLEEVE SCD COMPRESS KNEE MED (STOCKING) ×2 IMPLANT
TOWEL OR 17X24 6PK STRL BLUE (TOWEL DISPOSABLE) ×2 IMPLANT
TRAP TISSUE FILTER (MISCELLANEOUS) ×4 IMPLANT
VACURETTE 10 RIGID CVD (CANNULA) IMPLANT
VACURETTE 7MM CVD STRL WRAP (CANNULA) IMPLANT
VACURETTE 8 RIGID CVD (CANNULA) IMPLANT
VACURETTE 9 RIGID CVD (CANNULA) IMPLANT

## 2022-04-10 NOTE — H&P (Signed)
OB/GYN Pre-Op History and Physical  Stacy Collier is a 32 y.o. G2P0110 presenting for AUB concerning for rPOC.       Past Medical History:  Diagnosis Date   Anxiety    Depression    Miscarriage at 21 to [redacted] weeks gestation     Past Surgical History:  Procedure Laterality Date   WISDOM TOOTH EXTRACTION      OB History  Gravida Para Term Preterm AB Living  '2 1   1 1 '$ 0  SAB IAB Ectopic Multiple Live Births    '1   1 2    '$ # Outcome Date GA Lbr Len/2nd Weight Sex Delivery Anes PTL Lv  2A Preterm 2022-02-05 [redacted]w[redacted]d/ 00:05  F Vag-Spont EPI  ND     Birth Comments: severe prematurity  2B Preterm 12024/01/02238w2d 00:15  F Vag-Breech EPI  ND     Birth Comments: severe prematurity  1 IAB 2019 1094w0d        Social History   Socioeconomic History   Marital status: Single    Spouse name: Not on file   Number of children: Not on file   Years of education: Not on file   Highest education level: Bachelor's degree (e.g., BA, AB, BS)  Occupational History   Occupation: Accounting    Comment: [employer]  Tobacco Use   Smoking status: Never   Smokeless tobacco: Never  Substance and Sexual Activity   Alcohol use: Not Currently    Alcohol/week: 2.0 standard drinks of alcohol    Types: 2 Glasses of wine per week   Drug use: Never   Sexual activity: Not Currently  Other Topics Concern   Not on file  Social History Narrative   Single. 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 No pets.       Pt had preterm labor, premature rupture of membrane at [redacted] weeks gestation of identical twins.  Twins were born 12/January 02, 2022oth passed away shortly after birth.      No legal hx.    No caffeine.    Religion-Believes in God   Social Determinants of Health   Financial Resource Strain: Low Risk  (03/13/2022)   Overall Financial Resource Strain (CARDIA)    Difficulty of Paying Living Expenses: Not very hard  Food Insecurity: No Food Insecurity (03/28/2022)   Hunger Vital Sign     Worried About Running Out of Food in the Last Year: Never true    Ran Out of Food in the Last Year: Never true  Recent Concern: Food Insecurity - Food Insecurity Present (01/04/2022)   Hunger Vital Sign    Worried About Running Out of Food in the Last Year: Sometimes true    Ran Out of Food in the Last Year: Sometimes true  Transportation Needs: No Transportation Needs (03/28/2022)   PRAPARE - TraHydrologistedical): No    Lack of Transportation (Non-Medical): No  Physical Activity: Sufficiently Active (04/02/2018)   Exercise Vital Sign    Days of Exercise per Week: 3 days    Minutes of Exercise per Session: 60 min  Stress: Stress Concern Present (03/13/2022)   [place] Feeling of Stress : To some extent  Social Connections: Moderately Integrated (03/13/2022)   Social Connection and Isolation Panel [NHANES]    Frequency of Communication with Friends and Family: More than three times a week  Frequency of Social Gatherings with Friends and Family: More than three times a week    Attends Religious Services: More than 4 times per year    Active Member of [employer] or Organizations: No    Attends Music therapist: Never    Marital Status: Living with partner    Family History  Problem Relation Age of Onset   Anxiety disorder Mother    Schizophrenia Maternal Aunt    Healthy Maternal Grandfather    Healthy Maternal Grandmother    Cancer Paternal Grandmother     No medications prior to admission.    No Known Allergies  Review of Systems: Negative except for what is mentioned in HPI.     Physical Exam: BP 126/83   Pulse 69   Temp 98.4 F (36.9 C) (Oral)   Resp 17   Ht '5\' 3"'$  (1.6 m)   Wt 84.8 kg   LMP 03/11/2022 (Exact Date)   SpO2 99%   BMI 33.13 kg/m  CONSTITUTIONAL: Well-developed, well-nourished female in no acute distress.  HENT:  Normocephalic, atraumatic,  External right and left ear normal. Oropharynx is clear and moist EYES: Conjunctivae and EOM are normal. Pupils are equal, round, and reactive to light. No scleral icterus.  SKIN: Skin is warm and dry. No rash noted. Not diaphoretic. No erythema. No pallor. [place]: Alert and oriented to person, place, and time. Normal reflexes, muscle tone coordination. No cranial nerve deficit noted. PSYCHIATRIC: Normal mood and affect. Normal behavior. Normal judgment and thought content. RESPIRATORY: Normal respiratory effort PELVIC: Deferred   Pertinent Labs/Studies:   No results found for this or any previous visit (from the past 72 hour(s)).     Assessment and Plan :Stacy Collier is a 32 y.o. G2P0110 here for AUB concerning for rPOC.   Plan for [facility] for concern for rPOC Will give perip ABX   Darliss Cheney, M.D. Minimally Invasive Gynecologic Surgery and Pelvic Pain Specialist Attending [place], [place] for [employer], [place]

## 2022-04-10 NOTE — Anesthesia Preprocedure Evaluation (Signed)
Anesthesia Evaluation  Patient identified by MRN, date of birth, ID band Patient awake    Reviewed: Allergy & Precautions, NPO status , Patient's Chart, lab work & pertinent test results  Airway Mallampati: II  TM Distance: >3 FB Neck ROM: Full    Dental no notable dental hx.    Pulmonary neg pulmonary ROS   Pulmonary exam normal        Cardiovascular negative cardio ROS  Rhythm:Regular Rate:Normal     Neuro/Psych   Anxiety Depression    negative neurological ROS     GI/Hepatic negative GI ROS, Neg liver ROS,,,  Endo/Other  negative endocrine ROS    Renal/GU negative Renal ROS  negative genitourinary   Musculoskeletal negative musculoskeletal ROS (+)    Abdominal Normal abdominal exam  (+)   Peds  Hematology negative hematology ROS (+)   Anesthesia Other Findings   Reproductive/Obstetrics Retained POC                             Anesthesia Physical Anesthesia Plan  ASA: 2  Anesthesia Plan: General   Post-op Pain Management:    Induction: Intravenous  PONV Risk Score and Plan: 3 and Ondansetron, Dexamethasone, Midazolam and Treatment may vary due to age or medical condition  Airway Management Planned: Mask and LMA  Additional Equipment: None  Intra-op Plan:   Post-operative Plan: Extubation in OR  Informed Consent: I have reviewed the patients History and Physical, chart, labs and discussed the procedure including the risks, benefits and alternatives for the proposed anesthesia with the patient or authorized representative who has indicated his/her understanding and acceptance.     Dental advisory given  Plan Discussed with:   Anesthesia Plan Comments:        Anesthesia Quick Evaluation

## 2022-04-10 NOTE — Op Note (Signed)
Stacy Collier PROCEDURE DATE: 04/10/2022  PREOPERATIVE DIAGNOSIS: retained products of conception POSTOPERATIVE DIAGNOSIS: The same PROCEDURE:     Dilation and Evacuation SURGEON:  Dr. Darliss Cheney  INDICATIONS: 32 y.o. G2P0110 with loss of twins at 66 weeks in December 2023 with persistent heavy vaginal bleeding and thick endometrial stripe concerning for retained products needing surgical evacuation.  Risks of surgery were discussed with the patient including but not limited to: bleeding which may require transfusion; infection which may require antibiotics; injury to uterus or surrounding organs; need for additional procedures including laparotomy or laparoscopy; possibility of intrauterine scarring which may impair future fertility; and other postoperative/anesthesia complications. Written informed consent was obtained.    FINDINGS:  A 10 week size uterus, small amounts of products of conception, specimen sent to pathology.  ANESTHESIA: General, paracervical block. INTRAVENOUS FLUIDS:  250 ml of LR ESTIMATED BLOOD LOSS:  Less than 20 ml. SPECIMENS:  Products of conception sent to pathology COMPLICATIONS:  None immediate.  PROCEDURE DETAILS:  The patient received intravenous Doxycycline while in the preoperative area.  She was then taken to the operating room where general anesthesia was administered and was found to be adequate.  After an adequate timeout was performed, she was placed in the dorsal lithotomy position and examined; then prepped and draped in the sterile manner.   A vaginal speculum was then placed in the patient's vagina and a single tooth tenaculum was applied to the anterior lip of the cervix.  A paracervical block using 20 ml of 1% lidocaine with epinephrine was administered. The cervix was gently dilated to accommodate a 7 mm suction curette that was gently advanced to the uterine fundus. The suction device was then activated and curette slowly rotated to clear the  uterus of products of conception.  Suction curettage was done until complete emptying of the uterus was confirmed. There was minimal bleeding noted and gentle sharp curettage was performed, the tenaculum removed with good hemostasis noted.   All instruments were removed from the patient's vagina.  Sponge and instrument counts were correct times two  The patient tolerated the procedure well and was taken to the recovery area extubated, awake, and in stable condition.  The patient will be discharged to home as per PACU criteria.  Routine postoperative instructions given.  She will follow up in the office in 2-3 weeks for postoperative evaluation  Darliss Cheney, MD [place], [facility] for [employer], [place]

## 2022-04-10 NOTE — Transfer of Care (Signed)
Immediate Anesthesia Transfer of Care Note  Patient: Stacy Collier  Procedure(s) Performed: DILATATION AND EVACUATION (Uterus)  Patient Location: PACU  Anesthesia Type:General  Level of Consciousness: awake, alert , and oriented  Airway & Oxygen Therapy: Patient Spontanous Breathing  Post-op Assessment: Report given to RN and Post -op Vital signs reviewed and stable  Post vital signs: Reviewed and stable  Last Vitals:  Vitals Value Taken Time  BP 127/77 04/10/22 1245  Temp    Pulse 83 04/10/22 1250  Resp 15 04/10/22 1250  SpO2 100 % 04/10/22 1250  Vitals shown include unvalidated device data.  Last Pain:  Vitals:   04/10/22 1136  TempSrc: Oral  PainSc: 2       Patients Stated Pain Goal: 4 ([id number])  Complications: No notable events documented.

## 2022-04-10 NOTE — Anesthesia Procedure Notes (Signed)
Procedure Name: LMA Insertion Date/Time: 04/10/2022 12:17 PM  Performed by: Clearnce Sorrel, CRNAPre-anesthesia Checklist: Patient identified, Emergency Drugs available, Suction available and Patient being monitored Patient Re-evaluated:Patient Re-evaluated prior to induction Oxygen Delivery Method: Circle System Utilized Preoxygenation: Pre-oxygenation with 100% oxygen Induction Type: IV induction Ventilation: Mask ventilation without difficulty LMA: LMA inserted LMA Size: 4.0 Number of attempts: 1 Airway Equipment and Method: Bite block Placement Confirmation: positive ETCO2 Tube secured with: Tape Dental Injury: Teeth and Oropharynx as per pre-operative assessment

## 2022-04-10 NOTE — Brief Op Note (Signed)
04/10/2022  12:43 PM  PATIENT:  Stacy Collier  32 y.o. female  PRE-OPERATIVE DIAGNOSIS:  Retained POC  POST-OPERATIVE DIAGNOSIS:  Retained POC  PROCEDURE:  Procedure(s): DILATATION AND EVACUATION (N/A)  SURGEON:  Surgeon(s) and Role:    Darliss Cheney, MD - Primary  PHYSICIAN ASSISTANT: none  ASSISTANTS: none   ANESTHESIA:   general and paracervical block  EBL:  minimal   BLOOD ADMINISTERED:none  DRAINS: none   LOCAL MEDICATIONS USED:  LIDOCAINE with epinephrine  SPECIMEN:  Source of Specimen:  retained products of conception  DISPOSITION OF SPECIMEN:  PATHOLOGY  COUNTS:  YES  TOURNIQUET:  * No tourniquets in log *  DICTATION: .Note written in EPIC  PLAN OF CARE: Discharge to home after PACU  PATIENT DISPOSITION:  PACU - hemodynamically stable.   Delay start of Pharmacological VTE agent (>24hrs) due to surgical blood loss or risk of bleeding: not applicable

## 2022-04-10 NOTE — Discharge Instructions (Addendum)
Take ibuprofen and tylenol every 6 hours together (or alternating every 3 hours) for pain for the next 24 hours. No intercourse for at least 2 weeks. You will have some light bleeding for a few days but that should subside. If you have bleeding where you are soaking a pad in 1 hour for 2 hours, that is too heavy and you should go to the emergency room. You will be contacted with the pathology information and a general follow up to see how your bleeding is.     Post Anesthesia Home Care Instructions  Activity: Get plenty of rest for the remainder of the day. A responsible individual must stay with you for 24 hours following the procedure.  For the next 24 hours, DO NOT: -Drive a car -Paediatric nurse -Drink alcoholic beverages -Take any medication unless instructed by your physician -Make any legal decisions or sign important papers.  Meals: Start with liquid foods such as gelatin or soup. Progress to regular foods as tolerated. Avoid greasy, spicy, heavy foods. If nausea and/or vomiting occur, drink only clear liquids until the nausea and/or vomiting subsides. Call your physician if vomiting continues.  Special Instructions/Symptoms: Your throat may feel dry or sore from the anesthesia or the breathing tube placed in your throat during surgery. If this causes discomfort, gargle with warm salt water. The discomfort should disappear within 24 hours.

## 2022-04-10 NOTE — Anesthesia Postprocedure Evaluation (Signed)
Anesthesia Post Note  Patient: Stacy Collier  Procedure(s) Performed: DILATATION AND EVACUATION (Uterus)     Patient location during evaluation: PACU Anesthesia Type: General Level of consciousness: awake and alert Pain management: pain level controlled Vital Signs Assessment: post-procedure vital signs reviewed and stable Respiratory status: spontaneous breathing, nonlabored ventilation, respiratory function stable and patient connected to nasal cannula oxygen Cardiovascular status: blood pressure returned to baseline and stable Postop Assessment: no apparent nausea or vomiting Anesthetic complications: no   No notable events documented.  Last Vitals:  Vitals:   04/10/22 1300 04/10/22 1315  BP: 113/71 112/79  Pulse: 60   Resp: 12   Temp:  36.9 C  SpO2: 100%     Last Pain:  Vitals:   04/10/22 1315  TempSrc:   PainSc: 3                  Lillee Mooneyhan P Maikol Grassia

## 2022-04-11 ENCOUNTER — Telehealth: Payer: Self-pay | Admitting: Family Medicine

## 2022-04-11 ENCOUNTER — Ambulatory Visit: Payer: No Typology Code available for payment source | Admitting: Physician Assistant

## 2022-04-11 ENCOUNTER — Encounter (HOSPITAL_BASED_OUTPATIENT_CLINIC_OR_DEPARTMENT_OTHER): Payer: Self-pay | Admitting: Obstetrics and Gynecology

## 2022-04-11 NOTE — Telephone Encounter (Signed)
Patient would like a call back regarding medications

## 2022-04-12 LAB — SURGICAL PATHOLOGY

## 2022-04-12 NOTE — Telephone Encounter (Signed)
Called pt; pt states she spoke with someone yesterday who answered her questions.

## 2022-05-09 ENCOUNTER — Other Ambulatory Visit: Payer: Self-pay

## 2022-05-09 ENCOUNTER — Encounter: Payer: Self-pay | Admitting: Obstetrics and Gynecology

## 2022-05-09 ENCOUNTER — Ambulatory Visit (INDEPENDENT_AMBULATORY_CARE_PROVIDER_SITE_OTHER): Payer: Medicaid Other | Admitting: Obstetrics and Gynecology

## 2022-05-09 VITALS — BP 110/84 | HR 85 | Wt 191.9 lb

## 2022-05-09 DIAGNOSIS — O034 Incomplete spontaneous abortion without complication: Secondary | ICD-10-CM | POA: Diagnosis not present

## 2022-05-09 DIAGNOSIS — Z3A20 20 weeks gestation of pregnancy: Secondary | ICD-10-CM

## 2022-05-09 NOTE — Progress Notes (Signed)
   POSTOPERATIVE VISIT NOTE   Subjective:     Stacy Collier is a 32 y.o. G2P0110 who presents to the clinic 4 weeks status post  D&E  for  rPOC   Cramping can be all day Similar to a period type of cramp Haven't had any bleeding since the surgery No intercourse since surgery either  Cramping started after 2 weeks - initially felt fine  The following portions of the patient's history were reviewed and updated as appropriate: allergies, current medications, past family history, past medical history, past social history, past surgical history, and problem list..    Review of Systems Pertinent items are noted in HPI.    Objective:    BP 110/84   Pulse 85   Wt 191 lb 14.4 oz (87 kg)   LMP 03/11/2022 (Exact Date)   BMI 33.99 kg/m  General:  alert and cooperative  Abdomen: soft, bowel sounds active  Incision:   N/a    Pathology Results: FINAL MICROSCOPIC DIAGNOSIS:   A. RETAINED PRODUCTS OF CONCEPTION:  -  Abundant fragments of secretory phase type endometrium with focal  decidualized stroma and a scattered fragments of necrotic villous  appearing tissue and area of fibrosis with trophoblasts consistent with  retained products of conception.    Assessment:   Doing well postoperatively. Operative findings again reviewed. Pathology report discussed.    Plan:   1. Continue any current medications. 2. Observe for now - if no menses in the next few weeks, contact clinic and will evaluate further 3. Activity restrictions: none 4. Not interested in contraception at this time but does not want to get pregnant soon   Lorriane Shire, MD Obstetrician & Gynecologist, [facility] for [employer], [facility]

## 2022-11-20 ENCOUNTER — Ambulatory Visit (HOSPITAL_COMMUNITY)
Admission: EM | Admit: 2022-11-20 | Discharge: 2022-11-20 | Disposition: A | Discharge status: Home / Self Care | Payer: Medicaid Other | Attending: Physician Assistant | Admitting: Physician Assistant

## 2022-11-20 ENCOUNTER — Encounter (HOSPITAL_COMMUNITY): Payer: Self-pay

## 2022-11-20 DIAGNOSIS — L03039 Cellulitis of unspecified toe: Secondary | ICD-10-CM | POA: Diagnosis not present

## 2022-11-20 MED ORDER — DOXYCYCLINE HYCLATE 100 MG PO CAPS: 100.0000 mg | ORAL_CAPSULE | Freq: Two times a day (BID) | ORAL | 0 refills | Status: AC

## 2022-11-20 NOTE — ED Triage Notes (Signed)
Patient reports that she has had redness and swelling to the right great toe x 2-3 days. Patient has yellow drainage at the base of the nail.  Patient states she had her toes done at the salon 2 weeks ago.  Patient has been soaking her toe in epsom salt.

## 2022-11-20 NOTE — Discharge Instructions (Addendum)
Return if any problems.  Soak area 20 minutes 4 times a day

## 2022-11-21 NOTE — ED Provider Notes (Signed)
[facility]    CSN: [id number] Arrival date & time: 11/20/22  1746      History   Chief Complaint Chief Complaint  Patient presents with   Nail Problem    HPI Stacy Collier is a 32 y.o. female.   Pt complains of swelling and green discoloration to area around right nail. Pt reports this began after having a pedicure.  Pt reports area is painful. Pt reports area is getting worse.   The history is provided by the patient. No language interpreter was used.    Past Medical History:  Diagnosis Date   Anxiety    Depression    Miscarriage at 8 to [redacted] weeks gestation     Patient Active Problem List   Diagnosis Date Noted   Retained placenta and uterine membrane 04/10/2022   Postpartum hemorrhage 04/20/22   Neonatal death 02-15-22   Preterm labor in second trimester 01/21/2022   Preterm premature rupture of membranes (PPROM) with unknown onset of labor 01/20/2022   Anxiety 12/06/2021   Depression 12/06/2021    Past Surgical History:  Procedure Laterality Date   DILATION AND EVACUATION N/A 04/10/2022   Procedure: DILATATION AND EVACUATION;  Surgeon: Lorriane Shire, MD;  Location: [facility];  Service: Gynecology;  Laterality: N/A;   WISDOM TOOTH EXTRACTION      OB History     Gravida  2   Para  1   Term      Preterm  1   AB  1   Living  0      SAB      IAB  1   Ectopic      Multiple  1   Live Births  2            Home Medications    Prior to Admission medications   Medication Sig Start Date End Date Taking? Authorizing Provider  doxycycline (VIBRAMYCIN) 100 MG capsule Take 1 capsule (100 mg total) by mouth 2 (two) times daily. 11/20/22  Yes Elson Areas, PA-C  acetaminophen (TYLENOL) 500 MG tablet Take 1 tablet (500 mg total) by mouth every 6 (six) hours as needed. 04/10/22   Lorriane Shire, MD  ibuprofen (ADVIL) 600 MG tablet Take 1 tablet (600 mg total) by mouth every 6 (six) hours as needed for  headache, mild pain, moderate pain or cramping. 04/10/22   Lorriane Shire, MD    Family History Family History  Problem Relation Age of Onset   Anxiety disorder Mother    Schizophrenia Maternal Aunt    Healthy Maternal Grandfather    Healthy Maternal Grandmother    Cancer Paternal Grandmother     Social History Social History   Tobacco Use   Smoking status: Never   Smokeless tobacco: Never  Vaping Use   Vaping status: Never Used  Substance Use Topics   Alcohol use: Yes    Alcohol/week: 2.0 standard drinks of alcohol    Types: 2 Glasses of wine per week   Drug use: Never     Allergies   Patient has no known allergies.   Review of Systems Review of Systems  Skin:  Positive for wound.  All other systems reviewed and are negative.    Physical Exam Triage Vital Signs ED Triage Vitals [11/20/22 1814]  Encounter Vitals Group     BP 114/73     Systolic BP Percentile      Diastolic BP Percentile      Pulse Rate  76     Resp 14     Temp 98.3 F (36.8 C)     Temp Source Oral     SpO2 98 %     Weight      Height      Head Circumference      Peak Flow      Pain Score 7     Pain Loc      Pain Education      Exclude from Growth Chart    No data found.  Updated Vital Signs BP 114/73 (BP Location: Right Arm)   Pulse 76   Temp 98.3 F (36.8 C) (Oral)   Resp 14   LMP 11/16/2022   SpO2 98%   Visual Acuity Right Eye Distance:   Left Eye Distance:   Bilateral Distance:    Right Eye Near:   Left Eye Near:    Bilateral Near:     Physical Exam Vitals reviewed.  Musculoskeletal:        General: Swelling and tenderness present.     Comments: Discolored green pustule around nail,  nv and ns intact   Skin:    General: Skin is warm.  Neurological:     General: No focal deficit present.     Mental Status: She is alert.  Psychiatric:        Mood and Affect: Mood normal.      UC Treatments / Results  Labs (all labs ordered are listed, but only  abnormal results are displayed) Labs Reviewed - No data to display  EKG   Radiology No results found.  Procedures Incision and Drainage  Date/Time: 11/21/2022 3:52 PM  Performed by: Elson Areas, PA-C Authorized by: Elson Areas, PA-C   Consent:    Consent obtained:  Verbal   Consent given by:  Patient   Risks, benefits, and alternatives were discussed: yes     Risks discussed:  Bleeding   Alternatives discussed:  No treatment Universal protocol:    Procedure explained and questions answered to patient or proxy's satisfaction: yes     Immediately prior to procedure, a time out was called: yes   Location:    Location:  Lower extremity   Lower extremity location:  Toe   Toe location:  L big toe Pre-procedure details:    Skin preparation:  Chlorhexidine Anesthesia:    Anesthesia method:  None Procedure type:    Complexity:  Simple Procedure details:    Incision types:  Stab incision   Drainage amount:  Moderate   Wound treatment:  Wound left open Post-procedure details:    Procedure completion:  Tolerated  (including critical care time)  Medications Ordered in UC Medications - No data to display  Initial Impression / Assessment and Plan / UC Course  I have reviewed the triage vital signs and the nursing notes.  Pertinent labs & imaging results that were available during my care of the patient were reviewed by me and considered in my medical decision making (see chart for details).      Final Clinical Impressions(s) / UC Diagnoses   Final diagnoses:  Paronychia of great toe     Discharge Instructions      Return if any problems.  Soak area 20 minutes 4 times a day.     ED Prescriptions     Medication Sig Dispense Auth. Provider   doxycycline (VIBRAMYCIN) 100 MG capsule Take 1 capsule (100 mg total) by mouth 2 (two) times daily. 14  capsule Elson Areas, [place]      PDMP not reviewed this encounter. An After Visit Summary was printed and  given to the patient.       Elson Areas, [place] 11/21/22 1553

## 2024-02-02 ENCOUNTER — Ambulatory Visit: Admitting: Podiatry

## 2024-02-02 DIAGNOSIS — L600 Ingrowing nail: Secondary | ICD-10-CM | POA: Diagnosis not present

## 2024-02-02 DIAGNOSIS — B351 Tinea unguium: Secondary | ICD-10-CM | POA: Diagnosis not present

## 2024-02-02 NOTE — Patient Instructions (Signed)

## 2024-02-04 NOTE — Progress Notes (Signed)
 Subjective:   Patient ID: Stacy Collier, female   DOB: 33 y.o.   MRN: [id number]   HPI Chief Complaint  Patient presents with   Ingrown Toenail    Patient presents today for an ingrown toenail on her L hallux toe patient relates, patient relates she has not been seen for this before , patient denies any self medicating.   33 year old female presents the [place] above concerns.  She thinks she has been having some issues with her left big toenail for some time.  The area does get tender as it grows out.  The nail recently did split and part of the corner did come out.  Does not report any drainage or pus.  She has noticed some inflammation around the toenail itself.   Review of Systems  All other systems reviewed and are negative.  Past Medical History:  Diagnosis Date   Anxiety    Depression    Miscarriage at 8 to [redacted] weeks gestation     Past Surgical History:  Procedure Laterality Date   DILATION AND EVACUATION N/A 04/10/2022   Procedure: DILATATION AND EVACUATION;  Surgeon: Jeralyn Crutch, MD;  Location: [facility];  Service: Gynecology;  Laterality: N/A;   WISDOM TOOTH EXTRACTION      Current Medications[1]  Allergies[2]        Objective:  Physical Exam  General: AAO x3, NAD  Dermatological: On the left hallux the nail is loose in the nailbed distally only attached on the proximal aspect.  It is dystrophic with yellow, brown, white discoloration.  There is localized edema and erythema along the lateral, proximal nail fold but there is no ascending cellulitis.  No open lesions otherwise.  Incurvation present along the lateral nail border today.  Vascular: Dorsalis Pedis artery and Posterior Tibial artery pedal pulses are 2/4 bilateral with immedate capillary fill time. There is no pain with calf compression, swelling, warmth, erythema.   Neruologic: Grossly intact via light touch bilateral.   Musculoskeletal: Although no significant pain on exam she  states that the nail does cause discomfort as the nail becomes elongated       Assessment:   Left hallux ingrown toenail, dystrophy     Plan:  -Treatment options discussed including all alternatives, risks, and complications -Etiology of symptoms were discussed -At this time, recommended total nail removal without chemical matricectomy to the left hallux nail given inflammation of the nail, pain as well as loosening of the toenail.  Risks and complications were discussed with the patient for which they understand and  verbally consent to the procedure.  Discussed sequela of removing the toenail and what it could result in long-term.  By removing the nail is not a guarantee it will come back and better this a chance it comes back in the same, worse, not at all or possibly normal.  Under sterile conditions a total of 3 mL of a mixture of 2% lidocaine  plain and 0.5% Marcaine  plain was infiltrated in a hallux block fashion. Once anesthetized, the skin was prepped in sterile fashion. A tourniquet was then applied. N left hallux toenails removed in total and this remove all nail borders.  Once the nail was  Removed, the area was debrided and the underlying skin was intact. The area was irrigated and hemostasis was obtained.  A dry sterile dressing was applied. After application of the dressing the tourniquet was removed and there is found to be an immediate capillary refill time to the digit. The  patient tolerated the procedure well any complications. Post procedure instructions were discussed the patient for which he verbally understood. Follow-up in one week for nail check or sooner if any problems are to arise. Discussed signs/symptoms of worsening infection and directed to call the office immediately should any occur or go directly to the emergency room. In the meantime, encouraged to call the office with any questions, concerns, changes symptoms. - Nail was sent for culture  Return in about 2 weeks  (around 02/16/2024) for nail check, culture results.  Donnice JONELLE Fees DPM     [1]  Current Outpatient Medications:    doxycycline  (VIBRAMYCIN ) 100 MG capsule, Take 1 capsule (100 mg total) by mouth 2 (two) times daily., Disp: 14 capsule, Rfl: 0   ibuprofen  (ADVIL ) 600 MG tablet, Take 1 tablet (600 mg total) by mouth every 6 (six) hours as needed for headache, mild pain, moderate pain or cramping., Disp: 30 tablet, Rfl: 2   acetaminophen  (TYLENOL ) 500 MG tablet, Take 1 tablet (500 mg total) by mouth every 6 (six) hours as needed., Disp: 30 tablet, Rfl: 0 [2] No Known Allergies

## 2024-02-09 ENCOUNTER — Telehealth: Payer: Self-pay | Admitting: Lab

## 2024-02-09 ENCOUNTER — Ambulatory Visit: Payer: Self-pay | Admitting: Podiatry

## 2024-02-09 NOTE — Telephone Encounter (Signed)
 Patient is requesting note to work from home until next appointment sent to her mychart if possible.

## 2024-02-10 ENCOUNTER — Telehealth: Payer: Self-pay | Admitting: Podiatry

## 2024-02-10 ENCOUNTER — Encounter: Payer: Self-pay | Admitting: Podiatry

## 2024-02-10 NOTE — Telephone Encounter (Signed)
 Sent work note via MyChart and adv pt if not recd, I can email it to her. She is wanted to work remote till after next visit.

## 2024-02-23 ENCOUNTER — Ambulatory Visit: Admitting: Podiatry

## 2024-02-23 DIAGNOSIS — B351 Tinea unguium: Secondary | ICD-10-CM

## 2024-02-23 DIAGNOSIS — L600 Ingrowing nail: Secondary | ICD-10-CM | POA: Diagnosis not present

## 2024-02-23 NOTE — Patient Instructions (Signed)
 Fungal Nail Infection A fungal nail infection is a common infection of the toenails or fingernails. This condition affects toenails more often than fingernails. It often affects the great, or big, toes. More than one nail may be infected. The condition can be passed from person to person (is contagious). What are the causes? This condition is caused by a fungus, such as yeast or molds. Several types of fungi can cause the infection. These fungi are common in moist and warm areas. If your hands or feet come into contact with the fungus, it may get into a crack in your fingernail or toenail or in the surrounding skin, and cause an infection. What increases the risk? The following factors may make you more likely to develop this condition: Being of older age. Having certain medical conditions, such as: Athlete's foot. Diabetes. Poor circulation. A weak body defense system (immune system). Walking barefoot in areas where the fungus thrives, such as showers or locker rooms. Wearing shoes and socks that cause your feet to sweat. Having a nail injury or a recent nail surgery. What are the signs or symptoms? Symptoms of this condition include: A pale spot on the nail. Thickening of the nail. A nail that becomes yellow, brown, or white. A brittle or ragged nail edge. A nail that has lifted away from the nail bed. How is this diagnosed? This condition is diagnosed with a physical exam. Your health care provider may take a scraping or clipping from your nail to test for the fungus. How is this treated? Treatment is not needed for mild infections. If you have significant nail changes, treatment may include: Antifungal medicines taken by mouth (orally). You may need to take the medicine for several weeks or several months, and you may not see the results for a long time. These medicines can cause side effects. Ask your health care provider what problems to watch for. Antifungal nail polish or nail  cream. These may be used along with oral antifungal medicines. Laser treatment of the nail. Surgery to remove the nail. This may be needed for the most severe infections. It can take a long time, usually up to a year, for the infection to go away. The infection may also come back. Follow these instructions at home: Medicines Take or apply over-the-counter and prescription medicines only as told by your health care provider. Ask your health care provider about using over-the-counter mentholated ointment on your nails. Nail care Trim your nails often. Wash and dry your hands and feet every day. Keep your feet dry. To do this: Wear absorbent socks, and change your socks frequently. Wear shoes that allow air to circulate, such as sandals or canvas tennis shoes. Throw out old shoes. If you go to a nail salon, make sure you choose one that uses clean instruments. Use antifungal foot powder on your feet and in your shoes. General instructions Do not share personal items, such as towels or nail clippers. Do not walk barefoot in shower rooms or locker rooms. Wear rubber gloves if you are working with your hands in wet areas. Keep all follow-up visits. This is important. Contact a health care provider if: You have redness, pain, or pus near the toenail or fingernail. Your infection is not getting better, or it is getting worse after several months. You have more circulation problems near the toenail or fingernail. You have brown or black discoloration of the nail that spreads to the surrounding skin. Summary A fungal nail infection is a common  infection of the toenails or fingernails. Treatment is not needed for mild infections. If you have significant nail changes, treatment may include taking medicine orally and applying medicine to your nails. It can take a long time, usually up to a year, for the infection to go away. The infection may also come back. Take or apply over-the-counter and  prescription medicines only as told by your health care provider. This information is not intended to replace advice given to you by your health care provider. Make sure you discuss any questions you have with your health care provider. Document Revised: 04/24/2020 Document Reviewed: 04/24/2020 Elsevier Patient Education  2024 [employer]  --   Terbinafine Tablets What is this medication? TERBINAFINE (TER bin a feen) treats fungal infections of the nails. It belongs to a group of medications called antifungals. It will not treat infections caused by bacteria or viruses. This medicine may be used for other purposes; ask your health care provider or pharmacist if you have questions. COMMON BRAND NAME(S): Lamisil, Terbinex What should I tell my care team before I take this medication? They need to know if you have any of these conditions: Liver disease An unusual or allergic reaction to terbinafine, other medications, foods, dyes, or preservatives Pregnant or trying to get pregnant Breast-feeding How should I use this medication? Take this medication by mouth with water. Take it as directed on the prescription label at the same time every day. You can take it with or without food. If it upsets your stomach, take it with food. Keep taking it unless your care team tells you to stop. A special MedGuide will be given to you by the pharmacist with each prescription and refill. Be sure to read this information carefully each time. Talk to your care team about the use of this medication in children. Special care may be needed. Overdosage: If you think you have taken too much of this medicine contact a poison control center or emergency room at once. NOTE: This medicine is only for you. Do not share this medicine with others. What if I miss a dose? If you miss a dose, take it as soon as you can unless it is more than 4 hours late. If it is more than 4 hours late, skip the missed dose. Take the next  dose at the normal time. What may interact with this medication? Do not take this medication with any of the following: Pimozide Thioridazine This medication may also interact with the following: Beta blockers Caffeine Certain medications for mental health conditions Cimetidine Cyclosporine Medications for fungal infections, such as fluconazole or ketoconazole Medications for irregular heartbeat, such as amiodarone, flecainide, propafenone Rifampin Warfarin This list may not describe all possible interactions. Give your health care provider a list of all the medicines, herbs, non-prescription drugs, or dietary supplements you use. Also tell them if you smoke, drink alcohol, or use illegal drugs. Some items may interact with your medicine. What should I watch for while using this medication? Visit your care team for regular checks on your progress. Tell your care team if your symptoms do not start to get better or if they get worse. This medication may cause serious skin reactions. They can happen weeks to months after starting the medication. Contact your care team right away if you notice fevers or flu-like symptoms with a rash. The rash may be red or purple and then turn into blisters or peeling of the skin. You may also notice a red rash with  swelling of the face, lips, or lymph nodes in your neck or under your arms. This medication can make you more sensitive to the sun. Keep out of the sun. If you cannot avoid being in the sun, wear protective clothing and sunscreen. Do not use sun lamps, tanning beds, or tanning booths. What side effects may I notice from receiving this medication? Side effects that you should report to your care team as soon as possible: Allergic reactions--skin rash, itching, hives, swelling of the face, lips, tongue, or throat Change in sense of smell Change in taste Infection--fever, chills, cough, or sore throat Liver injury--right upper belly pain, loss of  appetite, nausea, light-colored stool, dark yellow or brown urine, yellowing skin or eyes, unusual weakness or fatigue Low red blood cell level--unusual weakness or fatigue, dizziness, headache, trouble breathing Lupus-like syndrome--joint pain, swelling, or stiffness, butterfly-shaped rash on the face, rashes that get worse in the sun, fever, unusual weakness or fatigue Rash, fever, and swollen lymph nodes Redness, blistering, peeling, or loosening of the skin, including inside the mouth Unusual bruising or bleeding Worsening mood, feelings of depression Side effects that usually do not require medical attention (report to your care team if they continue or are bothersome): Diarrhea Gas Headache Nausea Stomach pain Upset stomach This list may not describe all possible side effects. Call your doctor for medical advice about side effects. You may report side effects to FDA at 1-800-FDA-1088. Where should I keep my medication? Keep out of the reach of children and pets. Store between 20 and 25 degrees C (68 and 77 degrees F). Protect from light. Get rid of any unused medication after the expiration date. To get rid of medications that are no longer needed or have expired: Take the medication to a medication take-back program. Check with your pharmacy or law enforcement to find a location. If you cannot return the medication, check the label or package insert to see if the medication should be thrown out in the garbage or flushed down the toilet. If you are not sure, ask your care team. If it is safe to put it in the trash, take the medication out of the container. Mix the medication with cat litter, dirt, coffee grounds, or other unwanted substance. Seal the mixture in a bag or container. Put it in the trash. NOTE: This sheet is a summary. It may not cover all possible information. If you have questions about this medicine, talk to your doctor, pharmacist, or health care provider.  2024  [employer]vier/Gold Standard (2022-08-09 00:00:00)

## 2024-02-23 NOTE — Progress Notes (Unsigned)
 Wati to treat until nail comes in Good
